# Patient Record
Sex: Male | Born: 1960
Health system: Southern US, Community
[De-identification: ages and names within clinical notes are randomized; demographics above are authoritative.]

## PROBLEM LIST (undated history)

## (undated) DIAGNOSIS — J45909 Unspecified asthma, uncomplicated: Secondary | ICD-10-CM

## (undated) DIAGNOSIS — R7303 Prediabetes: Secondary | ICD-10-CM

## (undated) DIAGNOSIS — T7840XA Allergy, unspecified, initial encounter: Secondary | ICD-10-CM

## (undated) DIAGNOSIS — J309 Allergic rhinitis, unspecified: Secondary | ICD-10-CM

## (undated) DIAGNOSIS — E785 Hyperlipidemia, unspecified: Secondary | ICD-10-CM

## (undated) DIAGNOSIS — M1990 Unspecified osteoarthritis, unspecified site: Secondary | ICD-10-CM

## (undated) DIAGNOSIS — I10 Essential (primary) hypertension: Secondary | ICD-10-CM

## (undated) DIAGNOSIS — H3320 Serous retinal detachment, unspecified eye: Secondary | ICD-10-CM

## (undated) HISTORY — DX: Allergy, unspecified, initial encounter: T78.40XA

## (undated) HISTORY — DX: Unspecified osteoarthritis, unspecified site: M19.90

## (undated) HISTORY — DX: Essential (primary) hypertension: I10

## (undated) HISTORY — PX: REFRACTIVE SURGERY: SHX103

## (undated) HISTORY — DX: Unspecified asthma, uncomplicated: J45.909

## (undated) HISTORY — PX: HERNIA REPAIR: SHX51

## (undated) HISTORY — DX: Allergic rhinitis, unspecified: J30.9

## (undated) HISTORY — DX: Serous retinal detachment, unspecified eye: H33.20

## (undated) HISTORY — DX: Prediabetes: R73.03

## (undated) HISTORY — DX: Hyperlipidemia, unspecified: E78.5

---

## 2000-10-03 HISTORY — PX: RETINAL LASER PROCEDURE: SHX2339

## 2012-09-03 ENCOUNTER — Ambulatory Visit (INDEPENDENT_AMBULATORY_CARE_PROVIDER_SITE_OTHER): Payer: BC Managed Care – PPO | Admitting: Internal Medicine

## 2012-09-03 ENCOUNTER — Encounter: Payer: Self-pay | Admitting: Internal Medicine

## 2012-09-03 VITALS — BP 138/86 | HR 88 | Temp 98.0°F | Ht 72.0 in | Wt 226.0 lb

## 2012-09-03 DIAGNOSIS — E785 Hyperlipidemia, unspecified: Secondary | ICD-10-CM

## 2012-09-03 DIAGNOSIS — M79609 Pain in unspecified limb: Secondary | ICD-10-CM

## 2012-09-03 DIAGNOSIS — F17200 Nicotine dependence, unspecified, uncomplicated: Secondary | ICD-10-CM

## 2012-09-03 DIAGNOSIS — Z720 Tobacco use: Secondary | ICD-10-CM | POA: Insufficient documentation

## 2012-09-03 DIAGNOSIS — M79641 Pain in right hand: Secondary | ICD-10-CM | POA: Insufficient documentation

## 2012-09-03 DIAGNOSIS — I10 Essential (primary) hypertension: Secondary | ICD-10-CM

## 2012-09-03 MED ORDER — AMLODIPINE BESYLATE 10 MG PO TABS: 10.0000 mg | ORAL_TABLET | Freq: Every day | ORAL | Status: DC

## 2012-09-03 NOTE — Assessment & Plan Note (Signed)
RF  meds , RTC 3-4 months See instructions

## 2012-09-03 NOTE — Patient Instructions (Addendum)
Check the  blood pressure 2 or 3 times a week, be sure it is between 110/60 and 140/85. If it is consistently higher or lower, let me know  

## 2012-09-03 NOTE — Progress Notes (Signed)
  Subjective:    Patient ID: Gary Zimmerman, male    DOB: 1961/09/04, 51 y.o.   MRN: [id number]  HPI New patient, to get established. Hypertension, good medication compliance, amb blood pressures usually okay but from time to time it goes up to 140/90. He moved to this area a few months ago, since then his job is more physical and  is having right hand and wrist pain; occasionally the whole hand gets numb..  Past Medical History  Diagnosis Date  . Asthma     no problems in years   . Allergic rhinitis   . Mild hyperlipidemia   . HTN (hypertension)    Past Surgical History  Procedure Date  . Hernia repair     multiple as a child  . Retinal laser procedure 2002     R , x 2   . Refractive surgery    Family History  Problem Relation Age of Onset  . Diabetes Sister   . CAD      ?F, uncle  . Stroke      cousin  . Colon cancer Neg Hx   . Prostate cancer Neg Hx   . Breast cancer      MGM   History   Social History  . Marital Status: Married    Spouse Name: N/A    Number of Children: 3  . Years of Education: N/A   Occupational History  . plant Production designer, theatre/television/film     Social History Main Topics  . Smoking status: Former Games developer  . Smokeless tobacco: Current User    Types: Chew     Comment: quit x years , restarted ~ 2010  . Alcohol Use: Yes     Comment: socially   . Drug Use: No  . Sexually Active: Not on file   Other Topics Concern  . Not on file   Social History Narrative   Some college--2 sons, 1 daughter --Moved from [place] to [place] 11-2011     Review of Systems Denies any left hand problems. No lower extremity edema Denies any neck pain. Occasionally depending on the position he sleeps, his whole right arm gets numb, this is going on for years.     Objective:   Physical Exam General -- alert, well-developed Lungs -- normal respiratory effort, no intercostal retractions, no accessory muscle use, and normal breath sounds.   Heart-- normal rate, regular rhythm, no murmur,  and no gallop.   Extremities-- no pretibial edema bilaterally ; inspection on palpation of the wrists and hands without evidence of synovitis, range of motion normal. Neurologic-- alert & oriented X3 , DTRs and strength normal in all extremities. Psych-- Cognition and judgment appear intact. Alert and cooperative with normal attention span and concentration.  not anxious appearing and not depressed appearing.       Assessment & Plan:

## 2012-09-03 NOTE — Assessment & Plan Note (Signed)
Quit tobacco for 17 years, unfortunately restarted around 2010. Risks discussed, information about quitting provided.

## 2012-09-03 NOTE — Assessment & Plan Note (Signed)
No evidence of synovitis, occasionally the whole right hand gets numb particularly with always use. Plan: Advil as needed, wrist splinter, if  Problems increase  will call for referral

## 2012-11-09 ENCOUNTER — Encounter: Payer: Self-pay | Admitting: Internal Medicine

## 2012-11-09 ENCOUNTER — Ambulatory Visit (INDEPENDENT_AMBULATORY_CARE_PROVIDER_SITE_OTHER): Payer: BC Managed Care – PPO | Admitting: Internal Medicine

## 2012-11-09 VITALS — BP 140/86 | HR 85 | Temp 97.9°F | Wt 226.0 lb

## 2012-11-09 DIAGNOSIS — J029 Acute pharyngitis, unspecified: Secondary | ICD-10-CM

## 2012-11-09 DIAGNOSIS — B9789 Other viral agents as the cause of diseases classified elsewhere: Secondary | ICD-10-CM

## 2012-11-09 DIAGNOSIS — B349 Viral infection, unspecified: Secondary | ICD-10-CM

## 2012-11-09 LAB — POCT INFLUENZA A/B: Influenza B, POC: NEGATIVE

## 2012-11-09 MED ORDER — HYDROCODONE-HOMATROPINE 5-1.5 MG/5ML PO SYRP: 5.0000 mL | ORAL_SOLUTION | Freq: Four times a day (QID) | ORAL | Status: DC | PRN

## 2012-11-09 MED ORDER — OSELTAMIVIR PHOSPHATE 75 MG PO CAPS: 75.0000 mg | ORAL_CAPSULE | Freq: Two times a day (BID) | ORAL | Status: DC

## 2012-11-09 NOTE — Progress Notes (Signed)
  Subjective:    Patient ID: Gary Zimmerman, male    DOB: June 07, 1961, 52 y.o.   MRN: [id number]  HPI Acute visit, here with his wife Got sick last night: Sore throat, glands in the neck swelling, severe chills but no fever, + sweats. Wife has been sick with respiratory symptoms, currently on her second round of antibiotics. Patient has been exposed also to  strep  Past Medical History  Diagnosis Date  . Asthma     no problems in years   . Allergic rhinitis   . Mild hyperlipidemia   . HTN (hypertension)   . Retinal detachment ~ 10-2012   Past Surgical History  Procedure Laterality Date  . Hernia repair      multiple as a child  . Retinal laser procedure  2002     R , x 2   . Refractive surgery      Review of Systems No much cough, some sinus congestion with a small amount of green discharge. No nausea, vomiting, diarrhea. Mild body aches. The patient had a retinal detachment a month ago, this morning he had a injection in the eye.    Objective:   Physical Exam General -- alert, well-developed, nontoxic-appearing.   Neck -- lymph nodes with minimal enlargement bilaterally, slightly tender.   HEENT -- TM R obscured by wax, left is normal ; throat w/o redness, face symmetric and not tender to palpation, nose congested. Left pupil moderately dilated and fixed (status post a local injection today)   EOMI Lungs -- normal respiratory effort, no intercostal retractions, no accessory muscle use, and normal breath sounds.   Heart-- normal rate, regular rhythm, no murmur, and no gallop.   Extremities-- no axillary lymphadenopathy  Psych-- Cognition and judgment appear intact. Alert and cooperative with normal attention span and concentration.  not anxious appearing and not depressed appearing.      Assessment & Plan:  History of respiratory symptoms, chills and aches. Strep a negative, flu test negative. The patient most likely has a viral syndrome, influenza?. Will prescribe Tamiflu,  if he  starts coughing, recommend Mucinex and hydrocodone, he recently had an eye  proceeded so will try to suppress the cough the best we can See instructions

## 2012-11-09 NOTE — Patient Instructions (Addendum)
Rest, fluids , tylenol If  cough, take Mucinex DM twice a day as needed  If the cough persist, use hydrocodone, will make you sleepy Take tamiflu x 5 days Call if no better in few days  Call anytime if the symptoms are severe

## 2012-11-10 ENCOUNTER — Encounter: Payer: Self-pay | Admitting: Internal Medicine

## 2013-01-02 ENCOUNTER — Ambulatory Visit (INDEPENDENT_AMBULATORY_CARE_PROVIDER_SITE_OTHER): Payer: BC Managed Care – PPO | Admitting: Internal Medicine

## 2013-01-02 ENCOUNTER — Encounter: Payer: Self-pay | Admitting: Internal Medicine

## 2013-01-02 VITALS — BP 136/84 | HR 66 | Temp 97.6°F | Ht 72.0 in | Wt 228.0 lb

## 2013-01-02 DIAGNOSIS — Z0000 Encounter for general adult medical examination without abnormal findings: Secondary | ICD-10-CM

## 2013-01-02 DIAGNOSIS — I10 Essential (primary) hypertension: Secondary | ICD-10-CM

## 2013-01-02 LAB — CBC WITH DIFFERENTIAL/PLATELET
Basophils Relative: 0.8 % (ref 0.0–3.0)
Eosinophils Relative: 7.1 % — ABNORMAL HIGH (ref 0.0–5.0)
HCT: 44.8 % (ref 39.0–52.0)
Hemoglobin: 15.4 g/dL (ref 13.0–17.0)
Lymphs Abs: 1.4 10*3/uL (ref 0.7–4.0)
Monocytes Relative: 6.9 % (ref 3.0–12.0)
Platelets: 155 10*3/uL (ref 150.0–400.0)
RBC: 5.03 Mil/uL (ref 4.22–5.81)
WBC: 5.4 10*3/uL (ref 4.5–10.5)

## 2013-01-02 LAB — LIPID PANEL
Cholesterol: 189 mg/dL (ref 0–200)
LDL Cholesterol: 123 mg/dL — ABNORMAL HIGH (ref 0–99)
Triglycerides: 143 mg/dL (ref 0.0–149.0)
VLDL: 28.6 mg/dL (ref 0.0–40.0)

## 2013-01-02 LAB — COMPREHENSIVE METABOLIC PANEL
BUN: 14 mg/dL (ref 6–23)
CO2: 25 mEq/L (ref 19–32)
Creatinine, Ser: 1 mg/dL (ref 0.4–1.5)
GFR: 86.83 mL/min (ref >60.00)
Glucose, Bld: 104 mg/dL — ABNORMAL HIGH (ref 70–99)
Total Bilirubin: 1.3 mg/dL — ABNORMAL HIGH (ref 0.3–1.2)
Total Protein: 6.8 g/dL (ref 6.0–8.3)

## 2013-01-02 LAB — PSA: PSA: 0.35 ng/mL (ref 0.10–4.00)

## 2013-01-02 MED ORDER — FLUTICASONE PROPIONATE 50 MCG/ACT NA SUSP: 2.0000 | Freq: Every day | NASAL | Status: DC

## 2013-01-02 NOTE — Assessment & Plan Note (Addendum)
Last Td? < 10 years Never had a cscope, colonoscopy versus iFOB discussed, prefers a colonoscopy, refer to GI Diet and exercise discussed Labs EKG sinus brady, asx, no old EKG Nasal congestio, allergies-- cont zyrtec, add flonase, call if no better

## 2013-01-02 NOTE — Patient Instructions (Addendum)
Check the  blood pressure 2 or 3 times a week,use a large (arm) cuff be sure it is between 110/60 and 140/85. If it is consistently higher or lower, let me know. Exercise 3 hours a week  If you feel well and the  blood pressure is normal, then come back in one year for another physical.

## 2013-01-02 NOTE — Assessment & Plan Note (Signed)
See review of systems, BP was elevated at some point, BP today normal. Plan: No change, motor his BP with a large cuff (no wrist cuff)

## 2013-01-02 NOTE — Progress Notes (Signed)
  Subjective:    Patient ID: Gary Zimmerman, male    DOB: May 20, 1961, 52 y.o.   MRN: [id number]  HPI CPX  Past Medical History  Diagnosis Date  . Asthma     no problems in years   . Allergic rhinitis   . Mild hyperlipidemia   . HTN (hypertension)   . Retinal detachment ~ 10-2012   Past Surgical History  Procedure Laterality Date  . Hernia repair      multiple as a child  . Retinal laser procedure  2002     R , x 2   . Refractive surgery     History   Social History  . Marital Status: Married    Spouse Name: N/A    Number of Children: 3  . Years of Education: N/A   Occupational History  . plant Production designer, theatre/television/film     Social History Main Topics  . Smoking status: Former Games developer  . Smokeless tobacco: Former Neurosurgeon    Types: Chew     Comment: quit chewing ~ 10-2012  . Alcohol Use: Yes     Comment: socially   . Drug Use: No  . Sexually Active: Not on file   Other Topics Concern  . Not on file   Social History Narrative   Some college    2 sons, 1 daughter     Linton Ham from [place] to [place] 11-2011    Diet: needs improvment   Exercise: active , some hicking   Family History  Problem Relation Age of Onset  . Diabetes Sister   . CAD      ?F, uncle  . Stroke      cousin  . Colon cancer Neg Hx   . Prostate cancer Neg Hx   . Breast cancer      MGM     Review of Systems Good compliance with medication. A few months ago he check his BP with wrist cuff, got high readings, 180/90, no recent BP checks. Quit tobacco!, Since then has gained some weight. Had a cold few weeks ago, since then he has a persisting nasal congestion with occasional bloody discharge. 2 days ago started with itchy eyes and nose, taking Zyrtec with some relief. Has a number of eye problems, fortunately doing well. Denies cough or wheezing No chest pain or shortness of breath No nausea, vomiting, blood in the stools. No dysuria or difficulty urinating.     Objective:   Physical Exam General -- alert,  well-developed,.   Neck --no thyromegaly  Lungs -- normal respiratory effort, no intercostal retractions, no accessory muscle use, and normal breath sounds.   Heart-- normal rate, regular rhythm, no murmur, and no gallop.   Abdomen--soft, non-tender, no distention, no masses, no HSM, no guarding, and no rigidity.   Extremities-- no pretibial edema bilaterally Rectal-- No external abnormalities noted. Normal sphincter tone. No rectal masses or tenderness. Brown stool, Hemoccult negative Prostate:  Prostate gland firm and smooth, no enlargement, nodularity, tenderness, mass, asymmetry or induration. Neurologic-- alert & oriented X3 and strength normal in all extremities. Psych-- Cognition and judgment appear intact. Alert and cooperative with normal attention span and concentration.  not anxious appearing and not depressed appearing.      Assessment & Plan:

## 2013-01-03 ENCOUNTER — Encounter: Payer: Self-pay | Admitting: *Deleted

## 2013-01-11 ENCOUNTER — Encounter: Payer: Self-pay | Admitting: Internal Medicine

## 2013-01-23 ENCOUNTER — Telehealth: Payer: Self-pay | Admitting: Internal Medicine

## 2013-01-23 NOTE — Telephone Encounter (Signed)
In reference to Gastroenterology referral entered on 01/02/13, patient has never scheduled.  [facility] attempted to reach patient and left voice mail for a return call.  I have also left messages and mailed a letter for patient to contact me.  As of today, 01/23/13, patient not responding.

## 2013-01-23 NOTE — Telephone Encounter (Signed)
Noted, Will discuss further on return to the office

## 2013-04-09 ENCOUNTER — Other Ambulatory Visit: Payer: Self-pay | Admitting: Internal Medicine

## 2013-04-10 NOTE — Telephone Encounter (Signed)
Refill done.  

## 2013-07-01 ENCOUNTER — Other Ambulatory Visit: Payer: Self-pay | Admitting: Internal Medicine

## 2013-07-01 DIAGNOSIS — E785 Hyperlipidemia, unspecified: Secondary | ICD-10-CM

## 2013-07-01 NOTE — Telephone Encounter (Signed)
Amlodipine refill sent to [facility] on [address]

## 2013-12-30 ENCOUNTER — Other Ambulatory Visit: Payer: Self-pay | Admitting: Internal Medicine

## 2014-01-27 ENCOUNTER — Other Ambulatory Visit: Payer: Self-pay | Admitting: Internal Medicine

## 2014-01-28 ENCOUNTER — Other Ambulatory Visit: Payer: Self-pay | Admitting: Internal Medicine

## 2014-02-28 ENCOUNTER — Other Ambulatory Visit: Payer: Self-pay | Admitting: Internal Medicine

## 2014-04-30 ENCOUNTER — Other Ambulatory Visit: Payer: Self-pay | Admitting: Internal Medicine

## 2014-05-14 ENCOUNTER — Telehealth: Payer: Self-pay | Admitting: Internal Medicine

## 2014-05-14 MED ORDER — AMLODIPINE BESYLATE 10 MG PO TABS: ORAL_TABLET | ORAL | Status: DC

## 2014-05-14 NOTE — Telephone Encounter (Signed)
Caller name: Marvene StaffJohn Ekstrand Relation to pt: self  Call back number: [phone] Pharmacy: [facility] [phone]   Reason for call: pt had to cancel med follow up due to death in family. Pt is requesting a 2 month refill amLODipine (NORVASC) 10 MG  to hold him over until he's annual appointment 05/16/14. Please advise

## 2014-05-14 NOTE — Telephone Encounter (Signed)
Refill sent.

## 2014-05-16 ENCOUNTER — Ambulatory Visit: Payer: BC Managed Care – PPO | Admitting: Internal Medicine

## 2014-06-24 ENCOUNTER — Ambulatory Visit (INDEPENDENT_AMBULATORY_CARE_PROVIDER_SITE_OTHER): Payer: BC Managed Care – PPO | Admitting: Internal Medicine

## 2014-06-24 ENCOUNTER — Telehealth: Payer: Self-pay | Admitting: Internal Medicine

## 2014-06-24 ENCOUNTER — Encounter: Payer: Self-pay | Admitting: Internal Medicine

## 2014-06-24 VITALS — BP 132/84 | HR 78 | Temp 98.0°F | Ht 72.0 in | Wt 231.1 lb

## 2014-06-24 DIAGNOSIS — Z0000 Encounter for general adult medical examination without abnormal findings: Secondary | ICD-10-CM

## 2014-06-24 DIAGNOSIS — Z720 Tobacco use: Secondary | ICD-10-CM

## 2014-06-24 DIAGNOSIS — I10 Essential (primary) hypertension: Secondary | ICD-10-CM

## 2014-06-24 DIAGNOSIS — Z23 Encounter for immunization: Secondary | ICD-10-CM

## 2014-06-24 DIAGNOSIS — R351 Nocturia: Secondary | ICD-10-CM

## 2014-06-24 MED ORDER — AMLODIPINE BESYLATE 10 MG PO TABS: ORAL_TABLET | ORAL | Status: DC

## 2014-06-24 MED ORDER — FLUTICASONE PROPIONATE 50 MCG/ACT NA SUSP: NASAL | Status: DC

## 2014-06-24 NOTE — Patient Instructions (Signed)
Please go to the lab for a urine sample today.  Stop by the front desk and schedule labs to be done within few days (fasting) CMP, CBC, FLP, TSH, PSA, A1c ----dx V70  Please come back to the office in 6 months  for a routine check up   Check the  blood pressure 2 or 3 times a month  be sure it is between 110/60 and 140/85. Ideal blood pressure is 120/80. If it is consistently higher or lower, let me know

## 2014-06-24 NOTE — Progress Notes (Signed)
Pre visit review using our clinic review tool, if applicable. No additional management support is needed unless otherwise documented below in the visit note. 

## 2014-06-24 NOTE — Assessment & Plan Note (Signed)
Discuss risks, needs to see the dentist regularly, Wellbutrin? Chantix?

## 2014-06-24 NOTE — Telephone Encounter (Signed)
Caller name: Xhaiden  Relation to pt: self  Call back number: [phone] Pharmacy: [facility]   [phone]  Reason for call: any future RX pt would like sent to [facility]

## 2014-06-24 NOTE — Telephone Encounter (Signed)
Pharmacy changed in Pt chart.

## 2014-06-24 NOTE — Progress Notes (Signed)
Subjective:    Patient ID: Gary Zimmerman, male    DOB: 04-03-1961, 53 y.o.   MRN: [id number]  DOS:  06/24/2014 Type of visit - description : CPX Interval history: Hypertension, good medication compliance, ambulatory BPs usually in the 130s, occasionally 140s if he is anxious or stressed out. Also complained of nocturia sometimes one or 2 times. Also complained of the whole arm "falling asleep" sometimes the left, sometimes the R depended on how he sleeps. No neck or back pain. No lower extremity paresthesias.    ROS  Denies chest pain, difficulty breathing or lower extremity edema No nausea, vomiting, diarrhea blood in the stools. No dysuria, gross hematuria or difficulty urinating  Past Medical History  Diagnosis Date  . Asthma     no problems in years   . Allergic rhinitis   . Mild hyperlipidemia   . HTN (hypertension)   . Retinal detachment ~ 10-2012    sees eye MD    Past Surgical History  Procedure Laterality Date  . Hernia repair      multiple as a child  . Retinal laser procedure  2002     R , x 2   . Refractive surgery      History   Social History  . Marital Status: Married    Spouse Name: N/A    Number of Children: 3  . Years of Education: N/A   Occupational History  . plant Production designer, theatre/television/film     Social History Main Topics  . Smoking status: Former Games developer  . Smokeless tobacco: Current User    Types: Chew     Comment:    . Alcohol Use: Yes     Comment: socially   . Drug Use: No  . Sexual Activity: Not on file   Other Topics Concern  . Not on file   Social History Narrative   Some college    2 sons, 1 daughter     Linton Ham from [place] to [place] 11-2011          Family History  Problem Relation Age of Onset  . Diabetes Sister   . CAD Other     ?F, uncle  . Stroke Other     cousin  . Colon cancer Neg Hx   . Prostate cancer Neg Hx   . Breast cancer Other     MGM       Medication List       This list is accurate as of: 06/24/14 11:59 PM.  Always  use your most recent med list.               amLODipine 10 MG tablet  Commonly known as:  NORVASC  TAKE 1 TABLET BY MOUTH EVERY DAY     cetirizine 10 MG tablet  Commonly known as:  ZYRTEC  Take 10 mg by mouth daily.     fish oil-omega-3 fatty acids 1000 MG capsule  Take 1 g by mouth daily.     fluticasone 50 MCG/ACT nasal spray  Commonly known as:  FLONASE  USE 2 SPRAYS NASALLY DAILY.     multivitamin tablet  Take 1 tablet by mouth daily.           Objective:   Physical Exam BP 132/84  Pulse 78  Temp(Src) 98 F (36.7 C) (Oral)  Ht 6' (1.829 m)  Wt 231 lb 2 oz (104.838 kg)  BMI 31.34 kg/m2  SpO2 98% General -- alert, well-developed, NAD.  Neck --no thyromegaly ,  normal carotid pulse  HEENT-- Not pale.   Lungs -- normal respiratory effort, no intercostal retractions, no accessory muscle use, and normal breath sounds.  Heart-- normal rate, regular rhythm, no murmur.  Abdomen-- Not distended, good bowel sounds,soft, non-tender. Rectal-- No external abnormalities noted. Normal sphincter tone. No rectal masses or tenderness. Stool brown, Hemoccult negative  Prostate--Prostate gland firm and smooth, no enlargement, nodularity, tenderness, mass, asymmetry or induration. Extremities-- no pretibial edema bilaterally  Neurologic--  alert & oriented X3. Speech normal, gait appropriate for age, strength symmetric and appropriate for age.  Psych-- Cognition and judgment appear intact. Cooperative with normal attention span and concentration. No anxious or depressed appearing.       Assessment & Plan:

## 2014-06-24 NOTE — Assessment & Plan Note (Signed)
Ambulatory BPs well-controlled most of the time, occasionally in the 140s. Plan: Continue with present care, continue taking amb BPs, see instructions

## 2014-06-24 NOTE — Assessment & Plan Note (Signed)
Last Td? Today Flu shot today Never had a cscope,   refer to GI again Diet and exercise discussed Labs Include the A1c Other issues: Nocturia, DRE normal with taking a PSA and a urinalysis Upper extremity paresthesias, likely positional, recommend observation

## 2014-06-25 LAB — URINALYSIS, ROUTINE W REFLEX MICROSCOPIC
Bilirubin Urine: NEGATIVE
Hgb urine dipstick: NEGATIVE
KETONES UR: NEGATIVE
Leukocytes, UA: NEGATIVE
Nitrite: NEGATIVE
PH: 6 (ref 5.0–8.0)
RBC / HPF: NONE SEEN (ref 0–?)
SPECIFIC GRAVITY, URINE: 1.01 (ref 1.000–1.030)
Total Protein, Urine: NEGATIVE
Urine Glucose: NEGATIVE
Urobilinogen, UA: 0.2 (ref 0.0–1.0)

## 2014-06-25 LAB — URINE CULTURE
Colony Count: NO GROWTH
Organism ID, Bacteria: NO GROWTH

## 2014-06-26 ENCOUNTER — Other Ambulatory Visit (INDEPENDENT_AMBULATORY_CARE_PROVIDER_SITE_OTHER): Payer: BC Managed Care – PPO

## 2014-06-26 DIAGNOSIS — Z0000 Encounter for general adult medical examination without abnormal findings: Secondary | ICD-10-CM

## 2014-06-26 LAB — PSA: PSA: 0.3 ng/mL (ref 0.10–4.00)

## 2014-06-26 LAB — CBC WITH DIFFERENTIAL/PLATELET
BASOS ABS: 0 10*3/uL (ref 0.0–0.1)
Basophils Relative: 0.5 % (ref 0.0–3.0)
Eosinophils Absolute: 0.3 10*3/uL (ref 0.0–0.7)
Eosinophils Relative: 4.3 % (ref 0.0–5.0)
HEMATOCRIT: 43.7 % (ref 39.0–52.0)
Hemoglobin: 14.9 g/dL (ref 13.0–17.0)
LYMPHS ABS: 1.3 10*3/uL (ref 0.7–4.0)
Lymphocytes Relative: 21.7 % (ref 12.0–46.0)
MCHC: 34.1 g/dL (ref 30.0–36.0)
MCV: 89.4 fl (ref 78.0–100.0)
MONOS PCT: 8 % (ref 3.0–12.0)
Monocytes Absolute: 0.5 10*3/uL (ref 0.1–1.0)
NEUTROS ABS: 3.8 10*3/uL (ref 1.4–7.7)
Neutrophils Relative %: 65.5 % (ref 43.0–77.0)
Platelets: 168 10*3/uL (ref 150.0–400.0)
RBC: 4.89 Mil/uL (ref 4.22–5.81)
RDW: 12.3 % (ref 11.5–15.5)
WBC: 5.8 10*3/uL (ref 4.0–10.5)

## 2014-06-26 LAB — TSH: TSH: 2.78 u[IU]/mL (ref 0.35–4.50)

## 2014-06-26 LAB — LIPID PANEL
CHOLESTEROL: 200 mg/dL (ref 0–200)
HDL: 37.2 mg/dL — ABNORMAL LOW (ref >39.00)
LDL Cholesterol: 123 mg/dL — ABNORMAL HIGH (ref 0–99)
NonHDL: 162.8
Total CHOL/HDL Ratio: 5
Triglycerides: 197 mg/dL — ABNORMAL HIGH (ref 0.0–149.0)
VLDL: 39.4 mg/dL (ref 0.0–40.0)

## 2014-06-26 LAB — COMPREHENSIVE METABOLIC PANEL
ALT: 40 U/L (ref 0–53)
AST: 29 U/L (ref 0–37)
Albumin: 3.9 g/dL (ref 3.5–5.2)
Alkaline Phosphatase: 65 U/L (ref 39–117)
BILIRUBIN TOTAL: 1.2 mg/dL (ref 0.2–1.2)
BUN: 12 mg/dL (ref 6–23)
CALCIUM: 8.9 mg/dL (ref 8.4–10.5)
CO2: 29 mEq/L (ref 19–32)
CREATININE: 1.1 mg/dL (ref 0.4–1.5)
Chloride: 102 mEq/L (ref 96–112)
GFR: 74.66 mL/min (ref >60.00)
Glucose, Bld: 124 mg/dL — ABNORMAL HIGH (ref 70–99)
Potassium: 4.1 mEq/L (ref 3.5–5.1)
Sodium: 137 mEq/L (ref 135–145)
Total Protein: 6.8 g/dL (ref 6.0–8.3)

## 2014-06-26 LAB — HEMOGLOBIN A1C: Hgb A1c MFr Bld: 6.3 % (ref 4.6–6.5)

## 2014-06-27 ENCOUNTER — Encounter: Payer: Self-pay | Admitting: Internal Medicine

## 2014-10-31 ENCOUNTER — Ambulatory Visit (INDEPENDENT_AMBULATORY_CARE_PROVIDER_SITE_OTHER): Payer: BLUE CROSS/BLUE SHIELD | Admitting: Internal Medicine

## 2014-10-31 ENCOUNTER — Encounter: Payer: Self-pay | Admitting: Internal Medicine

## 2014-10-31 VITALS — BP 168/81 | HR 56 | Temp 98.2°F | Ht 72.0 in | Wt 233.2 lb

## 2014-10-31 DIAGNOSIS — M159 Polyosteoarthritis, unspecified: Secondary | ICD-10-CM

## 2014-10-31 DIAGNOSIS — Z720 Tobacco use: Secondary | ICD-10-CM

## 2014-10-31 DIAGNOSIS — I10 Essential (primary) hypertension: Secondary | ICD-10-CM

## 2014-10-31 DIAGNOSIS — M1990 Unspecified osteoarthritis, unspecified site: Secondary | ICD-10-CM

## 2014-10-31 DIAGNOSIS — M150 Primary generalized (osteo)arthritis: Secondary | ICD-10-CM

## 2014-10-31 HISTORY — DX: Unspecified osteoarthritis, unspecified site: M19.90

## 2014-10-31 MED ORDER — VARENICLINE TARTRATE 0.5 MG X 11 & 1 MG X 42 PO MISC: ORAL | Status: DC

## 2014-10-31 MED ORDER — VARENICLINE TARTRATE 1 MG PO TABS: 1.0000 mg | ORAL_TABLET | Freq: Two times a day (BID) | ORAL | Status: DC

## 2014-10-31 NOTE — Assessment & Plan Note (Signed)
Extensive discussion about tobacco cessation including nicotine supplementation, counseling, Wellbutrin, Chantix. He elected Chantix, how to use this medication and when to quit discuss with the patient. Prescription provided.

## 2014-10-31 NOTE — Assessment & Plan Note (Signed)
BP slightly elevated today, normal ambulatory BPs, no change

## 2014-10-31 NOTE — Progress Notes (Signed)
Subjective:    Patient ID: Gary Zimmerman, male    DOB: [birth year], 54 y.o.   MRN: [id number]  DOS:  10/31/2014 Type of visit - description : Here to discuss several concerns Interval history: Needs a referral for a colonoscopy Likes to quit tobacco, in the past has not tried any medications. Hypertension, BP noted to be slightly elevated, good compliance of medication, ambulatory BP usually normal Also DJD? Continue with shoulder pain, occasionally left hip pain after a heavy day of work.   ROS Denies fever, chills, weight loss or headaches Very seldom his arm goes to sleep after he sleeps, sx resolve quickly, no actual neck pain w/ radiation    Past Medical History  Diagnosis Date  . Asthma     no problems in years   . Allergic rhinitis   . Mild hyperlipidemia   . HTN (hypertension)   . Retinal detachment ~ 10-2012    sees eye MD  . DJD (degenerative joint disease) 10/31/2014    Past Surgical History  Procedure Laterality Date  . Hernia repair      multiple as a child  . Retinal laser procedure  2002     R , x 2   . Refractive surgery      History   Social History  . Marital Status: Married    Spouse Name: N/A    Number of Children: 3  . Years of Education: N/A   Occupational History  . plant Production designer, theatre/television/filmmanager     Social History Main Topics  . Smoking status: Former Games developermoker  . Smokeless tobacco: Current User    Types: Chew     Comment:    . Alcohol Use: Yes     Comment: socially   . Drug Use: No  . Sexual Activity: Not on file   Other Topics Concern  . Not on file   Social History Narrative   Some college    2 sons, 1 daughter     [place]             Medication List       This list is accurate as of: 10/31/14 11:59 PM.  Always use your most recent med list.               amLODipine 10 MG tablet  Commonly known as:  NORVASC  TAKE 1 TABLET BY MOUTH EVERY DAY     cetirizine 10 MG tablet  Commonly known as:  ZYRTEC  Take 10 mg  by mouth daily as needed.     fish oil-omega-3 fatty acids 1000 MG capsule  Take 1 g by mouth daily.     fluticasone 50 MCG/ACT nasal spray  Commonly known as:  FLONASE  USE 2 SPRAYS NASALLY DAILY.     multivitamin tablet  Take 1 tablet by mouth daily.     varenicline 1 MG tablet  Commonly known as:  CHANTIX  Take 1 tablet (1 mg total) by mouth 2 (two) times daily.     varenicline 0.5 MG X 11 & 1 MG X 42 tablet  Commonly known as:  CHANTIX STARTING MONTH PAK  Take one 0.5 mg tablet by mouth once daily for 3 days, then increase to one 0.5 mg tablet twice daily for 4 days, then increase to one 1 mg tablet twice daily.           Objective:   Physical Exam BP 168/81 mmHg  Pulse 56  Temp(Src) 98.2 F (36.8 C) (Oral)  Ht 6' (1.829 m)  Wt 233 lb 4 oz (105.802 kg)  BMI 31.63 kg/m2  SpO2 100%  General -- alert, well-developed, NAD.  Neck --no TTP, FROM HEENT-- Not pale.   Extremities-- no pretibial edema bilaterally ; hands and wrists without synovitis Neurologic--  alert & oriented X3. Speech normal, gait appropriate for age, strength symmetric and appropriate for age.  DTRs symmetric. Psych-- Cognition and judgment appear intact. Cooperative with normal attention span and concentration. No anxious or depressed appearing.         Assessment & Plan:    Today , I spent more than 25   min with the patient: >50% of the time counseling regards tobacco cessation, possible referral to ortho

## 2014-10-31 NOTE — Assessment & Plan Note (Addendum)
Long history of shoulder pain and occasional left hip pain, no hand-wrist synovitis on exam, no headaches or weight loss. Suspect DJD, discussed the use of Tylenol as needed, also discussed possibly a referral to ortho or sports medicine if  one specific joint is hurting more than others.

## 2014-10-31 NOTE — Patient Instructions (Signed)
Take Chantix for 3 months The first  month use  the starting package Quit tobacco 2 weeks after you start Chantix Please read  the side effects carefully    If you need more information about quitting tobacco  visit  the American Heart Association, it  is a great resource online at:  Mormon101.plHttp://www.heart.org/HEARTORG/   Tylenol  500 mg OTC 2 tabs a day every 8 hours as needed for pain  If the pain is  gradually increasing, please call for a referral

## 2014-10-31 NOTE — Progress Notes (Signed)
Pre visit review using our clinic review tool, if applicable. No additional management support is needed unless otherwise documented below in the visit note. 

## 2014-11-13 ENCOUNTER — Encounter: Payer: Self-pay | Admitting: Gastroenterology

## 2014-12-12 ENCOUNTER — Ambulatory Visit (AMBULATORY_SURGERY_CENTER): Payer: Self-pay | Admitting: *Deleted

## 2014-12-12 VITALS — Ht 71.0 in | Wt 236.8 lb

## 2014-12-12 DIAGNOSIS — Z1211 Encounter for screening for malignant neoplasm of colon: Secondary | ICD-10-CM

## 2014-12-12 MED ORDER — NA SULFATE-K SULFATE-MG SULF 17.5-3.13-1.6 GM/177ML PO SOLN: 1.0000 | Freq: Once | ORAL | Status: DC

## 2014-12-12 NOTE — Progress Notes (Signed)
No egg or soy allergy No issues with pasts sedation No diet pills No home 02 use, no c pap emmi video to email

## 2014-12-25 ENCOUNTER — Ambulatory Visit (INDEPENDENT_AMBULATORY_CARE_PROVIDER_SITE_OTHER): Payer: BLUE CROSS/BLUE SHIELD | Admitting: Internal Medicine

## 2014-12-25 ENCOUNTER — Encounter: Payer: Self-pay | Admitting: Internal Medicine

## 2014-12-25 VITALS — BP 128/82 | HR 77 | Temp 98.0°F | Ht 71.0 in | Wt 237.5 lb

## 2014-12-25 DIAGNOSIS — R7309 Other abnormal glucose: Secondary | ICD-10-CM | POA: Diagnosis not present

## 2014-12-25 DIAGNOSIS — R7303 Prediabetes: Secondary | ICD-10-CM

## 2014-12-25 DIAGNOSIS — Z720 Tobacco use: Secondary | ICD-10-CM

## 2014-12-25 DIAGNOSIS — E119 Type 2 diabetes mellitus without complications: Secondary | ICD-10-CM | POA: Insufficient documentation

## 2014-12-25 HISTORY — DX: Prediabetes: R73.03

## 2014-12-25 LAB — HEMOGLOBIN A1C: Hgb A1c MFr Bld: 6.4 % (ref 4.6–6.5)

## 2014-12-25 NOTE — Progress Notes (Signed)
Pre visit review using our clinic review tool, if applicable. No additional management support is needed unless otherwise documented below in the visit note. 

## 2014-12-25 NOTE — Assessment & Plan Note (Signed)
Doing great with Chantix, wonders if he has to "wean off" of that medication, recommend to follow a standard protocol and use it for 3 months

## 2014-12-25 NOTE — Progress Notes (Signed)
Subjective:    Patient ID: Gary Zimmerman, male    DOB: 07/01/1961, 54 y.o.   MRN: [id number]  DOS:  12/25/2014 Type of visit - description : rov Interval history: Follow-up from previous visit. Diagnosed with prediabetes, doing better with diet and exercise Quitting tobacco, on his first month of Chantix, doing great, abstinent for 2 weeks, very little cravings   Review of Systems Had nausea initially after he started Chantix but that is gone. Has noted increased thirst lately  Past Medical History  Diagnosis Date  . Asthma     no problems in years   . Allergic rhinitis   . Mild hyperlipidemia   . HTN (hypertension)   . Retinal detachment ~ 10-2012    sees eye MD  . DJD (degenerative joint disease) 10/31/2014  . Allergy     seasonal  . Prediabetes 12/25/2014    Past Surgical History  Procedure Laterality Date  . Hernia repair      multiple as a child  . Retinal laser procedure  2002     R , x 2   . Refractive surgery      lasik    History   Social History  . Marital Status: Married    Spouse Name: N/A  . Number of Children: 3  . Years of Education: N/A   Occupational History  . plant Freight forwarder     Social History Main Topics  . Smoking status: Former Research scientist (life sciences)  . Smokeless tobacco: Former Systems developer    Types: Chew     Comment:  on chantix  . Alcohol Use: 0.0 oz/week    0 Standard drinks or equivalent per week     Comment: socially   . Drug Use: No  . Sexual Activity: Not on file   Other Topics Concern  . Not on file   Social History Narrative   Some college    2 sons, 1 daughter     Dorie Rank from [place] to [place] 11-2011             Medication List       This list is accurate as of: 12/25/14 11:59 PM.  Always use your most recent med list.               amLODipine 10 MG tablet  Commonly known as:  NORVASC  TAKE 1 TABLET BY MOUTH EVERY DAY     cetirizine 10 MG tablet  Commonly known as:  ZYRTEC  Take 10 mg by mouth daily as needed.     fish  oil-omega-3 fatty acids 1000 MG capsule  Take 1 g by mouth daily.     fluticasone 50 MCG/ACT nasal spray  Commonly known as:  FLONASE  USE 2 SPRAYS NASALLY DAILY.     multivitamin tablet  Take 1 tablet by mouth daily.     Na Sulfate-K Sulfate-Mg Sulf Soln  Commonly known as:  SUPREP BOWEL PREP  Take 1 kit by mouth once. suprep as directed. No substitutions     varenicline 1 MG tablet  Commonly known as:  CHANTIX  Take 1 tablet (1 mg total) by mouth 2 (two) times daily.     varenicline 0.5 MG X 11 & 1 MG X 42 tablet  Commonly known as:  CHANTIX STARTING MONTH PAK  Take one 0.5 mg tablet by mouth once daily for 3 days, then increase to one 0.5 mg tablet twice daily for 4 days, then increase to one 1 mg tablet twice  daily.           Objective:   Physical Exam BP 128/82 mmHg  Pulse 77  Temp(Src) 98 F (36.7 C) (Oral)  Ht 5' 11"  (1.803 m)  Wt 237 lb 8 oz (107.729 kg)  BMI 33.14 kg/m2  SpO2 98% General:   Well developed, well nourished . NAD.   Neurologic:  alert & oriented X3.  Speech normal, gait appropriate for age and unassisted Psych--  Cognition and judgment appear intact.  Cooperative with normal attention span and concentration.  Behavior appropriate. No anxious or depressed appearing.       Assessment & Plan:

## 2014-12-25 NOTE — Assessment & Plan Note (Signed)
Last A1c discussed, Concept of A1c explained. We talk about diet, exercise and the importance of self learning, see instructions.  Today , I spent more than 15   min with the patient: >50% of the time counseling regards  diet, exercise, A1c meaning, self learning

## 2014-12-25 NOTE — Patient Instructions (Signed)
Get your blood work before you leave    Come back to the office in 4 months   for a routine check up    Two great books to learn about diabetes Diabetes for Dummies "The [facility] Diabetes diet" book   All about diabetes, great resource!  http://www.molina.com/http://www.[email or web address]/diabetes-information.html

## 2015-01-01 ENCOUNTER — Ambulatory Visit (AMBULATORY_SURGERY_CENTER): Payer: BLUE CROSS/BLUE SHIELD | Admitting: Gastroenterology

## 2015-01-01 ENCOUNTER — Encounter: Payer: Self-pay | Admitting: Gastroenterology

## 2015-01-01 VITALS — BP 125/86 | HR 70 | Temp 98.5°F | Resp 21 | Ht 71.0 in | Wt 236.0 lb

## 2015-01-01 DIAGNOSIS — Z1211 Encounter for screening for malignant neoplasm of colon: Secondary | ICD-10-CM | POA: Diagnosis not present

## 2015-01-01 DIAGNOSIS — K5730 Diverticulosis of large intestine without perforation or abscess without bleeding: Secondary | ICD-10-CM

## 2015-01-01 MED ORDER — SODIUM CHLORIDE 0.9 % IV SOLN: 500.0000 mL | INTRAVENOUS | Status: DC

## 2015-01-01 NOTE — Progress Notes (Signed)
Report to PACU, RN, vss, BBS= Clear.  

## 2015-01-01 NOTE — Op Note (Signed)
[facility] [address] [place], [id number]   COLONOSCOPY PROCEDURE REPORT  PATIENT: Gary Zimmerman, Gary Zimmerman  MR#: [id number] BIRTHDATE: 08/12/1961 , 53  yrs. old GENDER: male ENDOSCOPIST: Louis Meckelobert D Finbar Nippert, MD REFERRED XB:JYNWBY:Jose Drue NovelPaz, M.D. PROCEDURE DATE:  01/01/2015 PROCEDURE:   Colonoscopy, screening First Screening Colonoscopy - Avg.  risk and is 50 yrs.  old or older Yes.  Prior Negative Screening - Now for repeat screening. N/A  History of Adenoma - Now for follow-up colonoscopy & has been > or = to 3 yrs.  N/A ASA CLASS:   Class II INDICATIONS:Colorectal Neoplasm Risk Assessment for this procedure is average risk. MEDICATIONS: Monitored anesthesia care and Propofol 240 mg IV  DESCRIPTION OF PROCEDURE:   After the risks benefits and alternatives of the procedure were thoroughly explained, informed consent was obtained.  The digital rectal exam revealed no abnormalities of the rectum.   The LB [id number]  endoscope was introduced through the anus and advanced to the cecum, which was identified by both the appendix and ileocecal valve. No adverse events experienced.   The quality of the prep was (Suprep was used) excellent.  The instrument was then slowly withdrawn as the colon was fully examined.      COLON FINDINGS: There was mild diverticulosis noted in the sigmoid colon.   The examination was otherwise normal.  Retroflexed views revealed no abnormalities. The time to cecum = 3.3 Withdrawal time = 7.9   The scope was withdrawn and the procedure completed. COMPLICATIONS: There were no immediate complications.  ENDOSCOPIC IMPRESSION: 1.   Mild diverticulosis was noted in the sigmoid colon 2.   The examination was otherwise normal  RECOMMENDATIONS: Continue current colorectal screening recommendations for "routine risk" patients with a repeat colonoscopy in 10 years.  eSigned:  Louis Meckelobert D Perri Lamagna, MD 01/01/2015 2:03 PM   cc:

## 2015-01-01 NOTE — Patient Instructions (Signed)
YOU HAD AN ENDOSCOPIC PROCEDURE TODAY AT THE [facility]:   Refer to the procedure report that was given to you for any specific questions about what was found during the examination.  If the procedure report does not answer your questions, please call your gastroenterologist to clarify.  If you requested that your care partner not be given the details of your procedure findings, then the procedure report has been included in a sealed envelope for you to review at your convenience later.  YOU SHOULD EXPECT: Some feelings of bloating in the abdomen. Passage of more gas than usual.  Walking can help get rid of the air that was put into your GI tract during the procedure and reduce the bloating. If you had a lower endoscopy (such as a colonoscopy or flexible sigmoidoscopy) you may notice spotting of blood in your stool or on the toilet paper. If you underwent a bowel prep for your procedure, you may not have a normal bowel movement for a few days.  Please Note:  You might notice some irritation and congestion in your nose or some drainage.  This is from the oxygen used during your procedure.  There is no need for concern and it should clear up in a day or so.  SYMPTOMS TO REPORT IMMEDIATELY:   Following lower endoscopy (colonoscopy or flexible sigmoidoscopy):  Excessive amounts of blood in the stool  Significant tenderness or worsening of abdominal pains  Swelling of the abdomen that is new, acute  Fever of 100F or higher    For urgent or emergent issues, a gastroenterologist can be reached at any hour by calling [phone].   DIET: Your first meal following the procedure should be a small meal and then it is ok to progress to your normal diet. Heavy or fried foods are harder to digest and may make you feel nauseous or bloated.  Likewise, meals heavy in dairy and vegetables can increase bloating.  Drink plenty of fluids but you should avoid alcoholic beverages for 24  hours.  ACTIVITY:  You should plan to take it easy for the rest of today and you should NOT DRIVE or use heavy machinery until tomorrow (because of the sedation medicines used during the test).    FOLLOW UP: Our staff will call the number listed on your records the next business day following your procedure to check on you and address any questions or concerns that you may have regarding the information given to you following your procedure. If we do not reach you, we will leave a message.  However, if you are feeling well and you are not experiencing any problems, there is no need to return our call.  We will assume that you have returned to your regular daily activities without incident.  If any biopsies were taken you will be contacted by phone or by letter within the next 1-3 weeks.  Please call us at [phone] if you have not heard about the biopsies in 3 weeks.    SIGNATURES/CONFIDENTIALITY: You and/or your care partner have signed paperwork which will be entered into your electronic medical record.  These signatures attest to the fact that that the information above on your After Visit Summary has been reviewed and is understood.  Full responsibility of the confidentiality of this discharge information lies with you and/or your care-partner.   Information on diverticulosis and high fiber diet given to you today 

## 2015-01-02 ENCOUNTER — Telehealth: Payer: Self-pay | Admitting: *Deleted

## 2015-01-02 NOTE — Telephone Encounter (Signed)
  Follow up Call-  Call back number 01/01/2015  Post procedure Call Back phone  # [phone]  Permission to leave phone message Yes     Patient questions:  Do you have a fever, pain , or abdominal swelling? No. Pain Score  0 *  Have you tolerated food without any problems? Yes.    Have you been able to return to your normal activities? Yes.    Do you have any questions about your discharge instructions: Diet   No. Medications  No. Follow up visit  No.  Do you have questions or concerns about your Care? No.  Actions: * If pain score is 4 or above: No action needed, pain <4.

## 2015-01-30 ENCOUNTER — Encounter (HOSPITAL_BASED_OUTPATIENT_CLINIC_OR_DEPARTMENT_OTHER): Payer: Self-pay

## 2015-01-30 ENCOUNTER — Emergency Department (HOSPITAL_BASED_OUTPATIENT_CLINIC_OR_DEPARTMENT_OTHER): Payer: BLUE CROSS/BLUE SHIELD

## 2015-01-30 ENCOUNTER — Emergency Department (HOSPITAL_BASED_OUTPATIENT_CLINIC_OR_DEPARTMENT_OTHER)
Admission: EM | Admit: 2015-01-30 | Discharge: 2015-01-30 | Disposition: A | Discharge status: Home / Self Care | Payer: BLUE CROSS/BLUE SHIELD | Attending: Emergency Medicine | Admitting: Emergency Medicine

## 2015-01-30 DIAGNOSIS — Y929 Unspecified place or not applicable: Secondary | ICD-10-CM | POA: Insufficient documentation

## 2015-01-30 DIAGNOSIS — Z8739 Personal history of other diseases of the musculoskeletal system and connective tissue: Secondary | ICD-10-CM | POA: Diagnosis not present

## 2015-01-30 DIAGNOSIS — Z8669 Personal history of other diseases of the nervous system and sense organs: Secondary | ICD-10-CM | POA: Insufficient documentation

## 2015-01-30 DIAGNOSIS — J45909 Unspecified asthma, uncomplicated: Secondary | ICD-10-CM | POA: Insufficient documentation

## 2015-01-30 DIAGNOSIS — Z79899 Other long term (current) drug therapy: Secondary | ICD-10-CM | POA: Insufficient documentation

## 2015-01-30 DIAGNOSIS — Z87891 Personal history of nicotine dependence: Secondary | ICD-10-CM | POA: Insufficient documentation

## 2015-01-30 DIAGNOSIS — Y999 Unspecified external cause status: Secondary | ICD-10-CM | POA: Insufficient documentation

## 2015-01-30 DIAGNOSIS — Y288XXA Contact with other sharp object, undetermined intent, initial encounter: Secondary | ICD-10-CM | POA: Diagnosis not present

## 2015-01-30 DIAGNOSIS — Y939 Activity, unspecified: Secondary | ICD-10-CM | POA: Diagnosis not present

## 2015-01-30 DIAGNOSIS — I10 Essential (primary) hypertension: Secondary | ICD-10-CM | POA: Insufficient documentation

## 2015-01-30 DIAGNOSIS — S81812A Laceration without foreign body, left lower leg, initial encounter: Secondary | ICD-10-CM | POA: Diagnosis not present

## 2015-01-30 DIAGNOSIS — S8992XA Unspecified injury of left lower leg, initial encounter: Secondary | ICD-10-CM | POA: Diagnosis present

## 2015-01-30 DIAGNOSIS — Z8639 Personal history of other endocrine, nutritional and metabolic disease: Secondary | ICD-10-CM | POA: Diagnosis not present

## 2015-01-30 MED ORDER — LIDOCAINE-EPINEPHRINE 2 %-1:100000 IJ SOLN
INTRAMUSCULAR | Status: AC
  Filled 2015-01-30: qty 1

## 2015-01-30 MED ORDER — LIDOCAINE-EPINEPHRINE 2 %-1:100000 IJ SOLN: 20.0000 mL | Freq: Once | INTRAMUSCULAR | Status: DC

## 2015-01-30 NOTE — ED Provider Notes (Signed)
CSN: [id number]     Arrival date & time 01/30/15  1733 History   First MD Initiated Contact with Patient 01/30/15 1740     Chief Complaint  Patient presents with  . Extremity Laceration     (Consider location/radiation/quality/duration/timing/severity/associated sxs/prior Treatment) HPI Gary Zimmerman is a 54 y.o. male with history of hypertension, degenerative joint disease, prediabetes, presents to emergency department with left leg injury. Patient states he accidentally cut his left shin with a hatchet. Reports severe bleeding which is controlled with pressure at this time. Denies any numbness or weakness distal to the cut. Denies any other complaints. Tetanus is up-to-date.  Past Medical History  Diagnosis Date  . Asthma     no problems in years   . Allergic rhinitis   . Mild hyperlipidemia   . HTN (hypertension)   . Retinal detachment ~ 10-2012    sees eye MD  . DJD (degenerative joint disease) 10/31/2014  . Allergy     seasonal  . Prediabetes 12/25/2014   Past Surgical History  Procedure Laterality Date  . Hernia repair      multiple as a child  . Retinal laser procedure  2002     R , x 2   . Refractive surgery      lasik   Family History  Problem Relation Age of Onset  . Diabetes Sister   . CAD Other     ?F, uncle  . Stroke Other     cousin  . Colon cancer Neg Hx   . Prostate cancer Neg Hx   . Rectal cancer Neg Hx   . Stomach cancer Neg Hx   . Esophageal cancer Neg Hx   . Breast cancer Other     MGM   History  Substance Use Topics  . Smoking status: Former Games developermoker  . Smokeless tobacco: Former NeurosurgeonUser    Types: Chew     Comment:  on chantix  . Alcohol Use: 0.0 oz/week    0 Standard drinks or equivalent per week     Comment: socially     Review of Systems  Constitutional: Negative for fever and chills.  Musculoskeletal: Positive for arthralgias.  Skin: Positive for wound.  Neurological: Negative for weakness and numbness.  All other systems reviewed and are  negative.     Allergies  Review of patient's allergies indicates no known allergies.  Home Medications   Prior to Admission medications   Medication Sig Start Date End Date Taking? Authorizing Provider  amLODipine (NORVASC) 10 MG tablet TAKE 1 TABLET BY MOUTH EVERY DAY 06/24/14   Wanda PlumpJose E Paz, MD  cetirizine (ZYRTEC) 10 MG tablet Take 10 mg by mouth daily as needed.     Historical Provider, MD  fish oil-omega-3 fatty acids 1000 MG capsule Take 1 g by mouth daily.    Historical Provider, MD  fluticasone (FLONASE) 50 MCG/ACT nasal spray USE 2 SPRAYS NASALLY DAILY. Patient not taking: Reported on 10/31/2014 06/24/14   Wanda PlumpJose E Paz, MD  Multiple Vitamin (MULTIVITAMIN) tablet Take 1 tablet by mouth daily.    Historical Provider, MD  varenicline (CHANTIX STARTING MONTH PAK) 0.5 MG X 11 & 1 MG X 42 tablet Take one 0.5 mg tablet by mouth once daily for 3 days, then increase to one 0.5 mg tablet twice daily for 4 days, then increase to one 1 mg tablet twice daily. Patient not taking: Reported on 12/25/2014 10/31/14   Wanda PlumpJose E Paz, MD  varenicline (CHANTIX) 1 MG tablet Take  1 tablet (1 mg total) by mouth 2 (two) times daily. 10/31/14   Wanda Plump, MD   BP 180/91 mmHg  Pulse 82  Temp(Src) 98.4 F (36.9 C) (Oral)  Resp 18  Ht  (1.803 m)  Wt 225 lb (102.059 kg)  BMI 31.39 kg/m2  SpO2 99% Physical Exam  Constitutional: He appears well-developed and well-nourished. No distress.  Eyes: Conjunctivae are normal.  Cardiovascular: Normal rate, regular rhythm and normal heart sounds.   Pulmonary/Chest: Effort normal and breath sounds normal. No respiratory distress. He has no wheezes. He has no rales.  Musculoskeletal:  Full range of motion of the left ankle, all toes. Left foot is pink, warm. Cap refill less than 2 seconds to all toes  Neurological: He is alert.  Strength is intact with flexion and plantar flexion against resistance. Strength is intact of all toes with toe flexion and extension against  resistance. Sensation is intact and dorsal and plantar surface of the foot  Skin: Skin is warm and dry.  4 cm vertical laceration to the anterior mid shin with active small arterial bleed.  Nursing note and vitals reviewed.   ED Course  Procedures (including critical care time) Labs Review Labs Reviewed - No data to display  Imaging Review Dg Tibia/fibula Left  01/30/2015   CLINICAL DATA:  Laceration due to injury with hatchet  EXAM: LEFT TIBIA AND FIBULA - 2 VIEW  COMPARISON:  None.  FINDINGS: Frontal and lateral views were obtained. There is no demonstrable fracture or dislocation. A small calcification is noted posterior to the distal tibia, a finding of uncertain etiology but felt to be of benign etiology. There are spurs arising from the posterior inferior calcaneus. Joint spaces appear intact. No radiopaque foreign body.  IMPRESSION: No fracture or dislocation.  No radiopaque foreign body.   Electronically Signed   By: Bretta Bang III M.D.   On: 01/30/2015 20:10     EKG Interpretation None     LACERATION REPAIR Performed by: Jaynie Crumble A Authorized by: Jaynie Crumble A Consent: Verbal consent obtained. Risks and benefits: risks, benefits and alternatives were discussed Consent given by: patient Patient identity confirmed: provided demographic data Prepped and Draped in normal sterile fashion Wound explored  Laceration Location: left anterior shin  Laceration Length: 4cm  No Foreign Bodies seen or palpated  Anesthesia: local infiltration  Local anesthetic: lidocaine 2% w epinephrine  Anesthetic total: 4 ml  Irrigation method: syringe Amount of cleaning: standard  Skin closure: bleeding controled with 3 figure 8 sutures with vicril rapid 4.0. Prolene 3.0 used on the skin to close the wound  Number of sutures: 3 figure 8s, 6 skin  Technique: simple interrupted  Patient tolerance: Patient tolerated the procedure well with no immediate  complications.  MDM   Final diagnoses:  Laceration of lower leg, left, initial encounter   patient with a small arterial bleed from a laceration to the left lower leg. Bleeding was initially controlled with 3 figure-of-eight sutures. Patient is not on any blood thinners. Minimal blood loss at this time. Foot continues to be pink and warm with cap refill less than 2 seconds in all toes. X-ray of the shin obtained and is negative. Wound repaired with sutures. Patient monitored for an hour. He ambulated with no bleeding emergency department. Patient will be discharged home with pressure dressing, elevation of the leg. Follow-up as needed. Return precautions discussed.  Filed Vitals:   01/30/15 1737  BP: 180/91  Pulse: 82  Temp: 98.4 F (36.9 C)  TempSrc: Oral  Resp: 18  Height:  (1.803 m)  Weight: 225 lb (102.059 kg)  SpO2: 99%       Jaynie Crumble, PA-C 01/30/15 2229  Arby Barrette, MD 01/31/15 [phone]

## 2015-01-30 NOTE — ED Notes (Signed)
Pt reports cut leg with hatchet.  +pulses, sensation distal to injury.

## 2015-01-30 NOTE — Discharge Instructions (Signed)
Antibiotic ointment twice a day. Keep clean and covered. Keep pressure on and elevated for the next 24-48 hrs. Follow up as needed. Return if worsening bleeding or pain  Laceration Care, Adult A laceration is a cut or lesion that goes through all layers of the skin and into the tissue just beneath the skin. TREATMENT  Some lacerations may not require closure. Some lacerations may not be able to be closed due to an increased risk of infection. It is important to see your caregiver as soon as possible after an injury to minimize the risk of infection and maximize the opportunity for successful closure. If closure is appropriate, pain medicines may be given, if needed. The wound will be cleaned to help prevent infection. Your caregiver will use stitches (sutures), staples, wound glue (adhesive), or skin adhesive strips to repair the laceration. These tools bring the skin edges together to allow for faster healing and a better cosmetic outcome. However, all wounds will heal with a scar. Once the wound has healed, scarring can be minimized by covering the wound with sunscreen during the day for 1 full year. HOME CARE INSTRUCTIONS  For sutures or staples:  Keep the wound clean and dry.  If you were given a bandage (dressing), you should change it at least once a day. Also, change the dressing if it becomes wet or dirty, or as directed by your caregiver.  Wash the wound with soap and water 2 times a day. Rinse the wound off with water to remove all soap. Pat the wound dry with a clean towel.  After cleaning, apply a thin layer of the antibiotic ointment as recommended by your caregiver. This will help prevent infection and keep the dressing from sticking.  You may shower as usual after the first 24 hours. Do not soak the wound in water until the sutures are removed.  Only take over-the-counter or prescription medicines for pain, discomfort, or fever as directed by your caregiver.  Get your sutures or  staples removed as directed by your caregiver. For skin adhesive strips:  Keep the wound clean and dry.  Do not get the skin adhesive strips wet. You may bathe carefully, using caution to keep the wound dry.  If the wound gets wet, pat it dry with a clean towel.  Skin adhesive strips will fall off on their own. You may trim the strips as the wound heals. Do not remove skin adhesive strips that are still stuck to the wound. They will fall off in time. For wound adhesive:  You may briefly wet your wound in the shower or bath. Do not soak or scrub the wound. Do not swim. Avoid periods of heavy perspiration until the skin adhesive has fallen off on its own. After showering or bathing, gently pat the wound dry with a clean towel.  Do not apply liquid medicine, cream medicine, or ointment medicine to your wound while the skin adhesive is in place. This may loosen the film before your wound is healed.  If a dressing is placed over the wound, be careful not to apply tape directly over the skin adhesive. This may cause the adhesive to be pulled off before the wound is healed.  Avoid prolonged exposure to sunlight or tanning lamps while the skin adhesive is in place. Exposure to ultraviolet light in the first year will darken the scar.  The skin adhesive will usually remain in place for 5 to 10 days, then naturally fall off the skin. Do not  pick at the adhesive film. You may need a tetanus shot if:  You cannot remember when you had your last tetanus shot.  You have never had a tetanus shot. If you get a tetanus shot, your arm may swell, get red, and feel warm to the touch. This is common and not a problem. If you need a tetanus shot and you choose not to have one, there is a rare chance of getting tetanus. Sickness from tetanus can be serious. SEEK MEDICAL CARE IF:   You have redness, swelling, or increasing pain in the wound.  You see a red line that goes away from the wound.  You have  yellowish-white fluid (pus) coming from the wound.  You have a fever.  You notice a bad smell coming from the wound or dressing.  Your wound breaks open before or after sutures have been removed.  You notice something coming out of the wound such as wood or glass.  Your wound is on your hand or foot and you cannot move a finger or toe. SEEK IMMEDIATE MEDICAL CARE IF:   Your pain is not controlled with prescribed medicine.  You have severe swelling around the wound causing pain and numbness or a change in color in your arm, hand, leg, or foot.  Your wound splits open and starts bleeding.  You have worsening numbness, weakness, or loss of function of any joint around or beyond the wound.  You develop painful lumps near the wound or on the skin anywhere on your body. MAKE SURE YOU:   Understand these instructions.  Will watch your condition.  Will get help right away if you are not doing well or get worse. Document Released: 09/19/2005 Document Revised: 12/12/2011 Document Reviewed: 03/15/2011 [facility] Patient Information 2015 [place], [place]. This information is not intended to replace advice given to you by your health care provider. Make sure you discuss any questions you have with your health care provider.

## 2015-03-26 ENCOUNTER — Encounter: Payer: Self-pay | Admitting: Internal Medicine

## 2015-04-28 ENCOUNTER — Ambulatory Visit: Payer: BLUE CROSS/BLUE SHIELD | Admitting: Internal Medicine

## 2015-05-07 ENCOUNTER — Ambulatory Visit: Payer: BLUE CROSS/BLUE SHIELD | Admitting: Internal Medicine

## 2015-05-07 ENCOUNTER — Encounter: Payer: Self-pay | Admitting: Internal Medicine

## 2015-05-07 ENCOUNTER — Ambulatory Visit (INDEPENDENT_AMBULATORY_CARE_PROVIDER_SITE_OTHER): Payer: BLUE CROSS/BLUE SHIELD | Admitting: Internal Medicine

## 2015-05-07 VITALS — BP 122/76 | HR 55 | Temp 97.9°F | Ht 71.0 in | Wt 233.4 lb

## 2015-05-07 DIAGNOSIS — Z720 Tobacco use: Secondary | ICD-10-CM | POA: Diagnosis not present

## 2015-05-07 DIAGNOSIS — F329 Major depressive disorder, single episode, unspecified: Secondary | ICD-10-CM | POA: Diagnosis not present

## 2015-05-07 DIAGNOSIS — F419 Anxiety disorder, unspecified: Secondary | ICD-10-CM

## 2015-05-07 DIAGNOSIS — F32A Depression, unspecified: Secondary | ICD-10-CM

## 2015-05-07 MED ORDER — BUPROPION HCL 100 MG PO TABS: 100.0000 mg | ORAL_TABLET | Freq: Two times a day (BID) | ORAL | Status: DC

## 2015-05-07 NOTE — Progress Notes (Signed)
Pre visit review using our clinic review tool, if applicable. No additional management support is needed unless otherwise documented below in the visit note. 

## 2015-05-07 NOTE — Assessment & Plan Note (Signed)
Depressive mood started after he finished Chantix. Depression screen scored 13 which is moderate Depression. I recommended counseling, will also discuss Wellbutrin which could help with both depression and cravings after quitting tobacco. He agrees, will start Wellbutrin, see instructions, follow-up in 6 weeks.

## 2015-05-07 NOTE — Assessment & Plan Note (Addendum)
Recently finished Chantix, not chewing tobacco anymore.praised . Still having some cravings

## 2015-05-07 NOTE — Progress Notes (Signed)
Subjective:    Patient ID: Gary Zimmerman, male    DOB: March 24, 1961, 54 y.o.   MRN: [id number]  DOS:  05/07/2015 Type of visit - description : Follow-up Interval history: Recently finished Chantix and successfully quit chewing tobacco (has smoked a cigar one or 2 times). Since he finished Chantix he is feeling somewhat depressed, somewhat frustrated, not liking to do anything, in the dumps.    Review of Systems  stress increase lately due to his job, recently his son move back in and that is also stressful. Has gained some weight, was doing really well with walking but he simply stopped. No actual anxiety that he can tell.  Past Medical History  Diagnosis Date  . Asthma     no problems in years   . Allergic rhinitis   . Mild hyperlipidemia   . HTN (hypertension)   . Retinal detachment ~ 10-2012    sees eye MD  . DJD (degenerative joint disease) 10/31/2014  . Allergy     seasonal  . Prediabetes 12/25/2014    Past Surgical History  Procedure Laterality Date  . Hernia repair      multiple as a child  . Retinal laser procedure  2002     R , x 2   . Refractive surgery      lasik    History   Social History  . Marital Status: Married    Spouse Name: Aurther Loft  . Number of Children: 3  . Years of Education: N/A   Occupational History  . 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 Smoking status: Former Games developer  . Smokeless tobacco: Former Neurosurgeon    Types: Chew     Comment:  on chantix  . Alcohol Use: 0.0 oz/week    0 Standard drinks or equivalent per week     Comment: socially   . Drug Use: No  . Sexual Activity: Not on file   Other Topics Concern  . Not on file   Social History Narrative   Some college    2 sons, 1 daughter     [place] Ham from [place] to [place] 11-2011     son moved2016        Medication List       This list is accurate as of: 05/07/15 11:59 PM.  Always use your most recent med list.               amLODipine 10 MG tablet    Commonly known as:  NORVASC  TAKE 1 TABLET BY MOUTH EVERY DAY     buPROPion 100 MG tablet  Commonly known as:  WELLBUTRIN  Take 1 tablet (100 mg total) by mouth 2 (two) times daily.     cetirizine 10 MG tablet  Commonly known as:  ZYRTEC  Take 10 mg by mouth daily as needed.     fish oil-omega-3 fatty acids 1000 MG capsule  Take 1 g by mouth daily.     fluticasone 50 MCG/ACT nasal spray  Commonly known as:  FLONASE  USE 2 SPRAYS NASALLY DAILY.     multivitamin tablet  Take 1 tablet by mouth daily.           Objective:   Physical Exam BP 122/76 mmHg  Pulse 55  Temp(Src) 97.9 F (36.6 C) (Oral)  Ht 5\' 11"  (1.803 m)  Wt 233 lb 6 oz (105.858 kg)  BMI 32.56 kg/m2  SpO2 97% General:  Well developed, well nourished . NAD.  HEENT:  Normocephalic . Face symmetric, atraumatic Neurologic:  alert & oriented X3.  Speech normal, gait appropriate for age and unassisted Psych--  Cognition and judgment appear intact.  Cooperative with normal attention span and concentration.  Behavior appropriate. Mildly anxious but no depressed appearing.      Assessment & Plan:

## 2015-05-07 NOTE — Patient Instructions (Addendum)
wellbutrin 100 mg: 1 tablet daily for 10 days, then one tablet twice a day

## 2015-06-22 ENCOUNTER — Encounter: Payer: Self-pay | Admitting: *Deleted

## 2015-06-22 ENCOUNTER — Telehealth: Payer: Self-pay | Admitting: *Deleted

## 2015-06-22 NOTE — Telephone Encounter (Signed)
Pre-Visit Call completed with patient and chart updated.   Pre-Visit Info documented in Specialty Comments under [employer].    

## 2015-06-22 NOTE — Addendum Note (Signed)
Addended by: Noreene Larsson A on: 06/22/2015 04:02 PM   Modules accepted: Medications

## 2015-06-22 NOTE — Telephone Encounter (Signed)
Unable to reach patient at time of Pre-Visit Call.  Left message for patient to return call when available.    

## 2015-06-24 ENCOUNTER — Ambulatory Visit (INDEPENDENT_AMBULATORY_CARE_PROVIDER_SITE_OTHER): Payer: BLUE CROSS/BLUE SHIELD | Admitting: Internal Medicine

## 2015-06-24 ENCOUNTER — Encounter: Payer: Self-pay | Admitting: Internal Medicine

## 2015-06-24 VITALS — BP 122/70 | HR 59 | Temp 98.0°F | Ht 71.0 in | Wt 234.2 lb

## 2015-06-24 DIAGNOSIS — Z23 Encounter for immunization: Secondary | ICD-10-CM

## 2015-06-24 DIAGNOSIS — E785 Hyperlipidemia, unspecified: Secondary | ICD-10-CM

## 2015-06-24 DIAGNOSIS — Z0000 Encounter for general adult medical examination without abnormal findings: Secondary | ICD-10-CM | POA: Diagnosis not present

## 2015-06-24 DIAGNOSIS — Z1159 Encounter for screening for other viral diseases: Secondary | ICD-10-CM

## 2015-06-24 DIAGNOSIS — Z09 Encounter for follow-up examination after completed treatment for conditions other than malignant neoplasm: Secondary | ICD-10-CM

## 2015-06-24 LAB — LIPID PANEL
CHOLESTEROL: 210 mg/dL — AB (ref 0–200)
HDL: 40.3 mg/dL (ref >39.00)
LDL Cholesterol: 133 mg/dL — ABNORMAL HIGH (ref 0–99)
NonHDL: 169.65
Total CHOL/HDL Ratio: 5
Triglycerides: 184 mg/dL — ABNORMAL HIGH (ref 0.0–149.0)
VLDL: 36.8 mg/dL (ref 0.0–40.0)

## 2015-06-24 LAB — BASIC METABOLIC PANEL
BUN: 12 mg/dL (ref 6–23)
CHLORIDE: 102 meq/L (ref 96–112)
CO2: 31 meq/L (ref 19–32)
Calcium: 9.3 mg/dL (ref 8.4–10.5)
Creatinine, Ser: 1.03 mg/dL (ref 0.40–1.50)
GFR: 80.24 mL/min (ref >60.00)
Glucose, Bld: 128 mg/dL — ABNORMAL HIGH (ref 70–99)
Potassium: 4.1 mEq/L (ref 3.5–5.1)
Sodium: 138 mEq/L (ref 135–145)

## 2015-06-24 LAB — HEMOGLOBIN A1C: Hgb A1c MFr Bld: 6.4 % (ref 4.6–6.5)

## 2015-06-24 LAB — AST: AST: 25 U/L (ref 0–37)

## 2015-06-24 LAB — ALT: ALT: 36 U/L (ref 0–53)

## 2015-06-24 NOTE — Patient Instructions (Signed)
Get your blood work before you leave     Next visit  for a   routine checkup in 6 months, no fasting (15 minutes) Please schedule an appointment at the front desk  

## 2015-06-24 NOTE — Progress Notes (Signed)
Subjective:    Patient ID: Gary Zimmerman, male    DOB: Jul 11, 1961, 54 y.o.   MRN: [id number]  DOS:  06/24/2015 Type of visit - description : CPX Interval history: Feeling well, no major concerns, good compliance of medication   Review of Systems  Constitutional: No fever. No chills. No unexplained wt changes. No unusual sweats  HEENT: No dental problems, no ear discharge, no facial swelling, no voice changes. No eye discharge, no eye  redness , no  intolerance to light   Respiratory: No wheezing , no  difficulty breathing. No cough , no mucus production  Cardiovascular: No CP, no leg swelling , no  Palpitations  GI: no nausea, no vomiting, no diarrhea , no  abdominal pain.  No blood in the stools. No dysphagia, no odynophagia    Endocrine: No polyphagia, no polyuria , no polydipsia  GU: No dysuria, gross hematuria, difficulty urinating. No urinary urgency, no frequency.  Musculoskeletal: No joint swellings or unusual aches or pains  Skin: No change in the color of the skin, palor , no  Rash  Allergic, immunologic: No environmental allergies , no  food allergies  Neurological: No dizziness no  syncope. No headaches. No diplopia, no slurred, no slurred speech, no motor deficits, no facial  Numbness  Hematological: No enlarged lymph nodes, no easy bruising , no unusual bleedings  Psychiatry: No suicidal ideas, no hallucinations, no beavior problems, no confusion.  On Wellbutrin, symptoms well-controlled, + the stress at work, some stress at home (his son moved back in)  Past Medical History  Diagnosis Date  . Asthma     no problems in years   . Allergic rhinitis   . Mild hyperlipidemia   . HTN (hypertension)   . Retinal detachment ~ 10-2012    sees eye MD  . DJD (degenerative joint disease) 10/31/2014  . Allergy     seasonal  . Prediabetes 12/25/2014    Past Surgical History  Procedure Laterality Date  . Hernia repair      multiple as a child  . Retinal laser  procedure  2002     R , x 2   . Refractive surgery      lasik    Social History   Social History  . Marital Status: Married    Spouse Name: Aurther Loft  . Number of Children: 3  . Years of Education: N/A   Occupational History  . 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 Smoking status: Former Games developer  . Smokeless tobacco: Former Neurosurgeon    Types: Chew  . Alcohol Use: 0.0 oz/week    0 Standard drinks or equivalent per week     Comment: socially   . Drug Use: No  . Sexual Activity: Not on file   Other Topics Concern  . Not on file   Social History Narrative   Some college    2 sons, 1 daughter     [place] Ham from [place] to [place] 11-2011     son moved in home w/ them 2016   Family History  Problem Relation Age of Onset  . Diabetes Sister   . CAD Other     ?F, uncle  . Stroke Other     cousin  . Colon cancer Neg Hx   . Prostate cancer Neg Hx   . Rectal cancer Neg Hx   . Stomach cancer Neg Hx   . Esophageal cancer Neg Hx   .  Breast cancer Other     MGM         Medication List       This list is accurate as of: 06/24/15 11:59 PM.  Always use your most recent med list.               amLODipine 10 MG tablet  Commonly known as:  NORVASC  TAKE 1 TABLET BY MOUTH EVERY DAY     buPROPion 100 MG tablet  Commonly known as:  WELLBUTRIN  Take 1 tablet (100 mg total) by mouth 2 (two) times daily.     cetirizine 10 MG tablet  Commonly known as:  ZYRTEC  Take 10 mg by mouth daily as needed.     fish oil-omega-3 fatty acids 1000 MG capsule  Take 1 g by mouth daily.     fluticasone 50 MCG/ACT nasal spray  Commonly known as:  FLONASE  USE 2 SPRAYS NASALLY DAILY.     multivitamin tablet  Take 1 tablet by mouth daily.           Objective:   Physical Exam BP 122/70 mmHg  Pulse 59  Temp(Src) 98 F (36.7 C) (Oral)  Ht  (1.803 m)  Wt 234 lb 4 oz (106.255 kg)  BMI 32.69 kg/m2  SpO2 98% General:   Well developed, well nourished . NAD.    HEENT:  Normocephalic . Face symmetric, atraumatic Neck: No thyromegaly Lungs:  CTA B Normal respiratory effort, no intercostal retractions, no accessory muscle use. Heart: RRR,  no murmur.  no pretibial edema bilaterally  Abdomen:  Not distended, soft, non-tender. No rebound or rigidity. No mass,organomegaly Skin: Not pale. Not jaundice Neurologic:  alert & oriented X3.  Speech normal, gait appropriate for age and unassisted Psych--  Cognition and judgment appear intact.  Cooperative with normal attention span and concentration.  Behavior appropriate. No anxious or depressed appearing.    Assessment & Plan:  A/P: Depression: Started Wellbutrin 05-2015, symptoms improved Tobacco abuse: no further use of dipping, was having cravings, symptoms decrease when he started Wellbutrin 05-2015 Hypertension: Seems well-controlled, continue amlodipine Prediabetes: Labs today, last A1c 6.4. Diet-exercise discussed

## 2015-06-24 NOTE — Assessment & Plan Note (Addendum)
Td 2015, flu shot : today CCS:  cscope 12-2014-- tics, 10 years  Prostate cancer screening: DRE 06-2014 neg  PSA: 06/26/14- 0.30  Diet and exercise discussed Labs  : BMP, AST, ALT, FLP, A1c, hep C

## 2015-06-24 NOTE — Progress Notes (Signed)
Pre visit review using our clinic review tool, if applicable. No additional management support is needed unless otherwise documented below in the visit note. 

## 2015-06-25 DIAGNOSIS — Z09 Encounter for follow-up examination after completed treatment for conditions other than malignant neoplasm: Secondary | ICD-10-CM | POA: Insufficient documentation

## 2015-06-25 LAB — HEPATITIS C ANTIBODY: HCV Ab: NEGATIVE

## 2015-06-25 NOTE — Assessment & Plan Note (Signed)
Depression: Started Wellbutrin 05-2015, symptoms improved Tobacco abuse: no further use of dipping, was having cravings, symptoms decrease when he started Wellbutrin 05-2015 Hypertension: Seems well-controlled, continue amlodipine Prediabetes: Labs today, last A1c 6.4. Diet-exercise discussed

## 2015-06-29 MED ORDER — ATORVASTATIN CALCIUM 10 MG PO TABS: 10.0000 mg | ORAL_TABLET | Freq: Every day | ORAL | Status: DC

## 2015-06-29 NOTE — Addendum Note (Signed)
Addended by: Dorette Grate on: 06/29/2015 09:34 AM   Modules accepted: Orders

## 2015-07-06 ENCOUNTER — Other Ambulatory Visit: Payer: Self-pay | Admitting: Internal Medicine

## 2015-07-20 ENCOUNTER — Other Ambulatory Visit: Payer: Self-pay | Admitting: Internal Medicine

## 2015-10-27 MED FILL — ATORVASTATIN 10 MG TABLET: 10 | 30 days supply | Qty: 30 | Fill #3

## 2015-10-27 MED FILL — AMLODIPINE BESYLATE 10 MG T: 10 | 90 days supply | Qty: 90 | Fill #1

## 2015-12-22 ENCOUNTER — Encounter: Payer: Self-pay | Admitting: Internal Medicine

## 2015-12-22 ENCOUNTER — Ambulatory Visit (INDEPENDENT_AMBULATORY_CARE_PROVIDER_SITE_OTHER): Payer: BLUE CROSS/BLUE SHIELD | Admitting: Internal Medicine

## 2015-12-22 VITALS — BP 142/78 | HR 74 | Temp 98.1°F | Resp 16 | Ht 71.0 in | Wt 235.4 lb

## 2015-12-22 DIAGNOSIS — J302 Other seasonal allergic rhinitis: Secondary | ICD-10-CM | POA: Diagnosis not present

## 2015-12-22 DIAGNOSIS — E785 Hyperlipidemia, unspecified: Secondary | ICD-10-CM | POA: Diagnosis not present

## 2015-12-22 DIAGNOSIS — Z09 Encounter for follow-up examination after completed treatment for conditions other than malignant neoplasm: Secondary | ICD-10-CM

## 2015-12-22 DIAGNOSIS — R739 Hyperglycemia, unspecified: Secondary | ICD-10-CM

## 2015-12-22 DIAGNOSIS — I10 Essential (primary) hypertension: Secondary | ICD-10-CM

## 2015-12-22 LAB — HEMOGLOBIN A1C: HEMOGLOBIN A1C: 6.8 % — AB (ref 4.6–6.5)

## 2015-12-22 MED ORDER — AZELASTINE HCL 0.1 % NA SOLN: 2.0000 | Freq: Every day | NASAL | Status: DC

## 2015-12-22 MED ORDER — FLUTICASONE PROPIONATE 50 MCG/ACT NA SUSP: 2.0000 | Freq: Every day | NASAL | Status: DC

## 2015-12-22 MED ORDER — PRAVASTATIN SODIUM 20 MG PO TABS
20.0000 mg | ORAL_TABLET | Freq: Every day | ORAL | Status: DC
Start: 2015-12-22 — End: 2016-05-30

## 2015-12-22 MED ORDER — AMLODIPINE BESYLATE 10 MG PO TABS: 10.0000 mg | ORAL_TABLET | Freq: Every day | ORAL | Status: DC

## 2015-12-22 MED ORDER — BUPROPION HCL 100 MG PO TABS: 100.0000 mg | ORAL_TABLET | Freq: Every day | ORAL | Status: DC

## 2015-12-22 MED FILL — buPROPion HCL 100 MG TABS: 100 | 90 days supply | Qty: 90 | Fill #0

## 2015-12-22 MED FILL — FLUTICASONE PROP 50 MCG SPR: 50 | 90 days supply | Qty: 48 | Fill #0

## 2015-12-22 MED FILL — PRAVASTATIN NA 20 MG TAB: 20 | 90 days supply | Qty: 90 | Fill #0

## 2015-12-22 MED FILL — AZELASTINE 0.1% (137 MCG) S: 0.1 | 90 days supply | Qty: 90 | Fill #0

## 2015-12-22 NOTE — Assessment & Plan Note (Signed)
Prediabetes: Diet and exercise discussed, check A1c in 6 weeks  HTN: Well-controlled, refill amlodipine Hyperlipidemia: Lipitor intolerance, trial with Pravachol. Labs in 6 weeks Allergies: Continue with mild symptoms despite Zyrtec and Flonase, add Astelin RTC 4 months, see instructions.

## 2015-12-22 NOTE — Progress Notes (Signed)
Subjective:    Patient ID: Gary Zimmerman, male    DOB: 1961-02-21, 55 y.o.   MRN: [id number]  DOS:  12/22/2015 Type of visit - description : Follow-up Interval history:  prediabetes: States is not exercising enough. no amb CBGs Anxiety depression: Self decrease Wellbutrin to one tablet daily, feeling well, started a new job, remains optimistic. High cholesterol: Tried Lipitor for 2 weeks, was unable to sleep, self d/c it, alternatives?Marland Kitchen. HTN:  ambulatory BPs consistently less than 140. Today slightly high. Good  Amlodipine compliance Complaining of increased allergy symptoms with postnasal dripping and constantly clearing his throat  Review of Systems Denies fever chills Occasionally itchy eyes but not itchy nose. No cough, sputum production or chest congestion  Past Medical History  Diagnosis Date  . Asthma     no problems in years   . Allergic rhinitis   . Mild hyperlipidemia   . HTN (hypertension)   . Retinal detachment ~ 10-2012    sees eye MD  . DJD (degenerative joint disease) 10/31/2014  . Allergy     seasonal  . Prediabetes 12/25/2014    Past Surgical History  Procedure Laterality Date  . Hernia repair      multiple as a child  . Retinal laser procedure  2002     R , x 2   . Refractive surgery      lasik    Social History   Social History  . Marital Status: Married    Spouse Name: Aurther Lofterry  . Number of Children: 3  . Years of Education: N/A   Occupational History  . 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 Smoking status: Former Games developermoker  . Smokeless tobacco: Former NeurosurgeonUser    Types: Chew  . Alcohol Use: 0.0 oz/week    0 Standard drinks or equivalent per week     Comment: socially   . Drug Use: No  . Sexual Activity: Not on file   Other Topics Concern  . Not on file   Social History Narrative   Some college    2 sons, 1 daughter     [place]     son moved in home w/ them 2016        Medication List         This list is accurate as of: 12/22/15  7:16 PM.  Always use your most recent med list.               amLODipine 10 MG tablet  Commonly known as:  NORVASC  Take 1 tablet (10 mg total) by mouth daily.     azelastine 0.1 % nasal spray  Commonly known as:  ASTELIN  Place 2 sprays into both nostrils at bedtime. Use in each nostril as directed     buPROPion 100 MG tablet  Commonly known as:  WELLBUTRIN  Take 1 tablet (100 mg total) by mouth daily.     cetirizine 10 MG tablet  Commonly known as:  ZYRTEC  Take 10 mg by mouth daily as needed.     fish oil-omega-3 fatty acids 1000 MG capsule  Take 1 g by mouth daily.     fluticasone 50 MCG/ACT nasal spray  Commonly known as:  FLONASE  Place 2 sprays into both nostrils daily.     multivitamin tablet  Take 1 tablet by mouth daily.     pravastatin 20 MG tablet  Commonly  known as:  PRAVACHOL  Take 1 tablet (20 mg total) by mouth daily.           Objective:   Physical Exam BP 142/78 mmHg  Pulse 74  Temp(Src) 98.1 F (36.7 C) (Oral)  Resp 16  Ht  (1.803 m)  Wt 235 lb 6 oz (106.765 kg)  BMI 32.84 kg/m2 General:   Well developed, well nourished . NAD.  HEENT:  Normocephalic . Face symmetric, atraumatic Lungs:  CTA B Normal respiratory effort, no intercostal retractions, no accessory muscle use. Heart: RRR,  no murmur.  no pretibial edema bilaterally  Abdomen:  Not distended, soft, non-tender. No rebound or rigidity.  Skin: Not pale. Not jaundice Neurologic:  alert & oriented X3.  Speech normal, gait appropriate for age and unassisted Psych--  Cognition and judgment appear intact.  Cooperative with normal attention span and concentration.  Behavior appropriate. No anxious or depressed appearing.    Assessment & Plan:   Assessment Prediabetes 12-2014 HTN Hyperlipidemia DJD H/o  asthma H/o retinal detachment 2014  Plan: Prediabetes: Diet and exercise discussed, check A1c in 6 weeks  HTN:  Well-controlled, refill amlodipine Hyperlipidemia: Lipitor intolerance, trial with Pravachol. Labs in 6 weeks Allergies: Continue with mild symptoms despite Zyrtec and Flonase, add Astelin RTC 4 months, see instructions.

## 2015-12-22 NOTE — Progress Notes (Signed)
Pre visit review using our clinic review tool, if applicable. No additional management support is needed unless otherwise documented below in the visit note. 

## 2015-12-22 NOTE — Patient Instructions (Addendum)
GO TO THE LAB :      Get the blood work     GO TO THE FRONT DESK  Schedule labs to be done in 6 weeks (FLP AST ALT), fasting  Schedule your next appointment for a  Follow up When?   4-5 months  Fasting?  No   Check the  blood pressure 2 or 3 times a month   Be sure your blood pressure is between 110/65 and  145/85. If it is consistently higher or lower, let me know   For allergies: zyrtec, flonase in the morning and astelin at night

## 2016-02-02 ENCOUNTER — Other Ambulatory Visit (INDEPENDENT_AMBULATORY_CARE_PROVIDER_SITE_OTHER): Payer: BLUE CROSS/BLUE SHIELD

## 2016-02-02 DIAGNOSIS — E785 Hyperlipidemia, unspecified: Secondary | ICD-10-CM

## 2016-02-02 LAB — ALT: ALT: 31 U/L (ref 0–53)

## 2016-02-02 LAB — LIPID PANEL
CHOL/HDL RATIO: 5
CHOLESTEROL: 185 mg/dL (ref 0–200)
HDL: 39.5 mg/dL (ref >39.00)
NonHDL: 145.65
Triglycerides: 213 mg/dL — ABNORMAL HIGH (ref 0.0–149.0)
VLDL: 42.6 mg/dL — AB (ref 0.0–40.0)

## 2016-02-02 LAB — LDL CHOLESTEROL, DIRECT: LDL DIRECT: 119 mg/dL

## 2016-02-02 LAB — AST: AST: 22 U/L (ref 0–37)

## 2016-02-08 MED FILL — AMLODIPINE BESYLATE 10 MG T: 10 | 90 days supply | Qty: 90 | Fill #2

## 2016-04-06 MED FILL — PRAVASTATIN NA 20 MG TAB: 20 | 90 days supply | Qty: 90 | Fill #1

## 2016-04-06 MED FILL — buPROPion HCL 100 MG TABS: 100 | 90 days supply | Qty: 90 | Fill #1

## 2016-04-20 ENCOUNTER — Telehealth: Payer: Self-pay | Admitting: Internal Medicine

## 2016-04-21 NOTE — Telephone Encounter (Signed)
Complete

## 2016-04-27 ENCOUNTER — Ambulatory Visit: Payer: BLUE CROSS/BLUE SHIELD | Admitting: Internal Medicine

## 2016-05-30 ENCOUNTER — Ambulatory Visit (INDEPENDENT_AMBULATORY_CARE_PROVIDER_SITE_OTHER): Payer: PRIVATE HEALTH INSURANCE | Admitting: Internal Medicine

## 2016-05-30 ENCOUNTER — Encounter: Payer: Self-pay | Admitting: Internal Medicine

## 2016-05-30 VITALS — BP 124/70 | HR 69 | Temp 98.1°F | Resp 14 | Ht 71.0 in | Wt 236.0 lb

## 2016-05-30 DIAGNOSIS — E785 Hyperlipidemia, unspecified: Secondary | ICD-10-CM

## 2016-05-30 DIAGNOSIS — E119 Type 2 diabetes mellitus without complications: Secondary | ICD-10-CM

## 2016-05-30 DIAGNOSIS — I10 Essential (primary) hypertension: Secondary | ICD-10-CM | POA: Diagnosis not present

## 2016-05-30 LAB — BASIC METABOLIC PANEL
BUN: 10 mg/dL (ref 6–23)
CALCIUM: 9.3 mg/dL (ref 8.4–10.5)
CO2: 27 meq/L (ref 19–32)
Chloride: 101 mEq/L (ref 96–112)
Creatinine, Ser: 1.03 mg/dL (ref 0.40–1.50)
GFR: 79.95 mL/min (ref >60.00)
GLUCOSE: 117 mg/dL — AB (ref 70–99)
POTASSIUM: 4 meq/L (ref 3.5–5.1)
SODIUM: 137 meq/L (ref 135–145)

## 2016-05-30 LAB — CBC WITH DIFFERENTIAL/PLATELET
Basophils Absolute: 0 10*3/uL (ref 0.0–0.1)
Basophils Relative: 0.4 % (ref 0.0–3.0)
EOS PCT: 2.2 % (ref 0.0–5.0)
Eosinophils Absolute: 0.2 10*3/uL (ref 0.0–0.7)
HCT: 46.3 % (ref 39.0–52.0)
Hemoglobin: 15.9 g/dL (ref 13.0–17.0)
LYMPHS ABS: 1.5 10*3/uL (ref 0.7–4.0)
Lymphocytes Relative: 15.5 % (ref 12.0–46.0)
MCHC: 34.4 g/dL (ref 30.0–36.0)
MCV: 87.6 fl (ref 78.0–100.0)
MONO ABS: 0.4 10*3/uL (ref 0.1–1.0)
MONOS PCT: 4.6 % (ref 3.0–12.0)
NEUTROS ABS: 7.5 10*3/uL (ref 1.4–7.7)
NEUTROS PCT: 77.3 % — AB (ref 43.0–77.0)
PLATELETS: 195 10*3/uL (ref 150.0–400.0)
RBC: 5.29 Mil/uL (ref 4.22–5.81)
RDW: 12 % (ref 11.5–15.5)
WBC: 9.7 10*3/uL (ref 4.0–10.5)

## 2016-05-30 LAB — HEMOGLOBIN A1C: Hgb A1c MFr Bld: 6.3 % (ref 4.6–6.5)

## 2016-05-30 MED ORDER — AZELASTINE HCL 0.1 % NA SOLN: 2.0000 | Freq: Every day | NASAL | 1 refills | Status: DC

## 2016-05-30 MED ORDER — PRAVASTATIN SODIUM 20 MG PO TABS: 20.0000 mg | ORAL_TABLET | Freq: Every day | ORAL | 1 refills | Status: DC

## 2016-05-30 MED ORDER — AMLODIPINE BESYLATE 10 MG PO TABS: 10.0000 mg | ORAL_TABLET | Freq: Every day | ORAL | 1 refills | Status: DC

## 2016-05-30 MED ORDER — BUPROPION HCL 100 MG PO TABS: 100.0000 mg | ORAL_TABLET | Freq: Every day | ORAL | 1 refills | Status: DC

## 2016-05-30 MED FILL — AMLODIPINE BESYLATE 10 MG T: 10 | 90 days supply | Qty: 90 | Fill #0

## 2016-05-30 MED FILL — AZELASTINE 0.1% (137 MCG) S: 0.1 | 90 days supply | Qty: 90 | Fill #0

## 2016-05-30 NOTE — Patient Instructions (Signed)
GO TO THE LAB : Get the blood work     GO TO THE FRONT DESK Schedule your next appointment for a physical in 4 to 6 months

## 2016-05-30 NOTE — Assessment & Plan Note (Signed)
DM: Has definitely improved his lifestyle, check an A1c HTN: Controlled, check a BMP and CBC Hyperlipidemia: Started a low-dose Pravachol, LDL improved, closer to goal. No change URI: Recommend observation, call if not improving RTC 4-6 months, CPX

## 2016-05-30 NOTE — Progress Notes (Signed)
Subjective:    Patient ID: Gary Zimmerman, male    DOB: 24-May-1961, 55 y.o.   MRN: [id number]  DOS:  05/30/2016 Type of visit - description : Routine office visit Interval history: Has improved his lifestyle, walking consistently 6-8 miles weekly, feels great. Developed a sore throat and left ear ache yesterday. Denies cough, fever or chills.   Review of Systems Denies chest pain or difficulty breathing No nausea, vomiting or diarrhea  Past Medical History:  Diagnosis Date  . Allergic rhinitis   . Allergy    seasonal  . Asthma    no problems in years   . DJD (degenerative joint disease) 10/31/2014  . HTN (hypertension)   . Mild hyperlipidemia   . Prediabetes 12/25/2014  . Retinal detachment ~ 10-2012   sees eye MD    Past Surgical History:  Procedure Laterality Date  . HERNIA REPAIR     multiple as a child  . REFRACTIVE SURGERY     lasik  . RETINAL LASER PROCEDURE  2002    R , x 2     Social History   Social History  . Marital status: Married    Spouse name: Aurther Lofterry  . Number of children: 3  . Years of education: N/A   Occupational History  . 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 Smoking status: Former Games developermoker  . Smokeless tobacco: Former NeurosurgeonUser    Types: Chew  . Alcohol use 0.0 oz/week     Comment: socially   . Drug use: No  . Sexual activity: Not on file   Other Topics Concern  . Not on file   Social History Narrative   Some college    2 sons, 1 daughter     [place]     son moved in home w/ them 2016        Medication List       Accurate as of 05/30/16  5:48 PM. Always use your most recent med list.          amLODipine 10 MG tablet Commonly known as:  NORVASC Take 1 tablet (10 mg total) by mouth daily.   azelastine 0.1 % nasal spray Commonly known as:  ASTELIN Place 2 sprays into both nostrils at bedtime. Use in each nostril as directed   buPROPion 100 MG tablet Commonly known as:   WELLBUTRIN Take 1 tablet (100 mg total) by mouth daily.   cetirizine 10 MG tablet Commonly known as:  ZYRTEC Take 10 mg by mouth daily as needed.   fish oil-omega-3 fatty acids 1000 MG capsule Take 1 g by mouth daily.   fluticasone 50 MCG/ACT nasal spray Commonly known as:  FLONASE Place 2 sprays into both nostrils daily.   multivitamin tablet Take 1 tablet by mouth daily.   pravastatin 20 MG tablet Commonly known as:  PRAVACHOL Take 1 tablet (20 mg total) by mouth daily.          Objective:   Physical Exam BP 124/70 (BP Location: Left Arm, Patient Position: Sitting, Cuff Size: Normal)   Pulse 69   Temp 98.1 F (36.7 C) (Oral)   Resp 14   Ht 5\' 11"  (1.803 m)   Wt 236 lb (107 kg)   SpO2 97%   BMI 32.92 kg/m  General:   Well developed, well nourished . NAD.  HEENT:  Normocephalic . Face symmetric, atraumatic. Unable to see the right  TM due to wax, left TM normal (slightly bulge). Throat symmetric, minimal redness, no discharge Lungs:  CTA B Normal respiratory effort, no intercostal retractions, no accessory muscle use. Heart: RRR,  no murmur.  Trace pretibial edema bilaterally  Skin: Not pale. Not jaundice Neurologic:  alert & oriented X3.  Speech normal, gait appropriate for age and unassisted Psych--  Cognition and judgment appear intact.  Cooperative with normal attention span and concentration.  Behavior appropriate. No anxious or depressed appearing.      Assessment & Plan:   Assessment DM: A1c increased to 6.8 12-2015 HTN Hyperlipidemia Tobacco-- took chantix, now on wellbutrin DJD H/o  asthma H/o retinal detachment 2014   PLAN: DM: Has definitely improved his lifestyle, check an A1c HTN: Controlled, check a BMP and CBC Hyperlipidemia: Started a low-dose Pravachol, LDL improved, closer to goal. No change URI: Recommend observation, call if not improving RTC 4-6 months, CPX

## 2016-05-30 NOTE — Progress Notes (Signed)
Pre visit review using our clinic review tool, if applicable. No additional management support is needed unless otherwise documented below in the visit note. 

## 2016-06-02 IMAGING — DX DG TIBIA/FIBULA 2V*L*
4 series · 4 of 4 positions shown · non-contrast
Comparison: None.

CLINICAL DATA: Laceration due to injury with hatchet

EXAM:
LEFT TIBIA AND FIBULA - 2 VIEW

[tibia ap (1 of 2)]
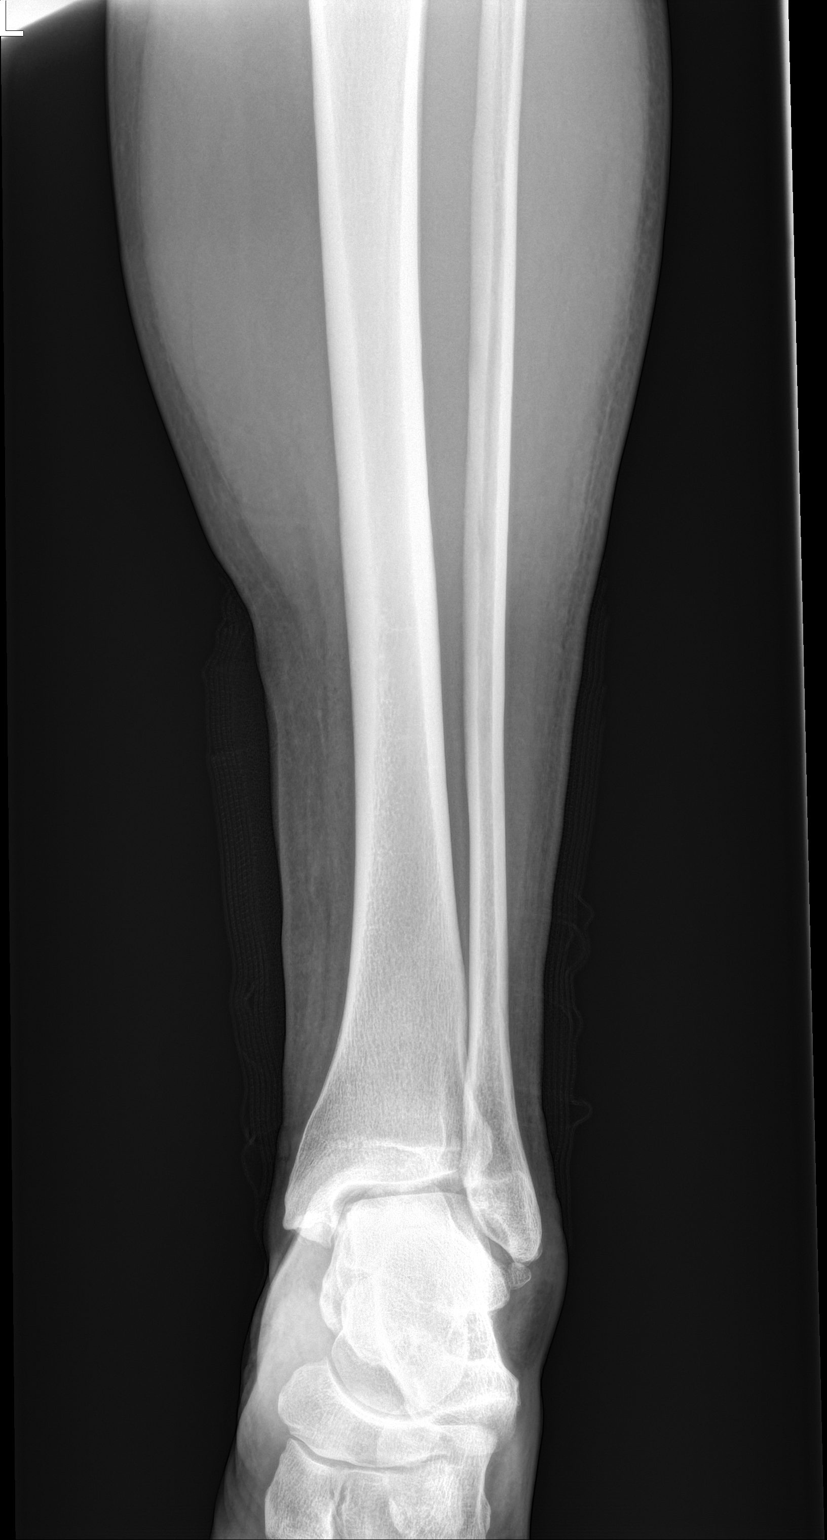

[tibia ap (2 of 2)]
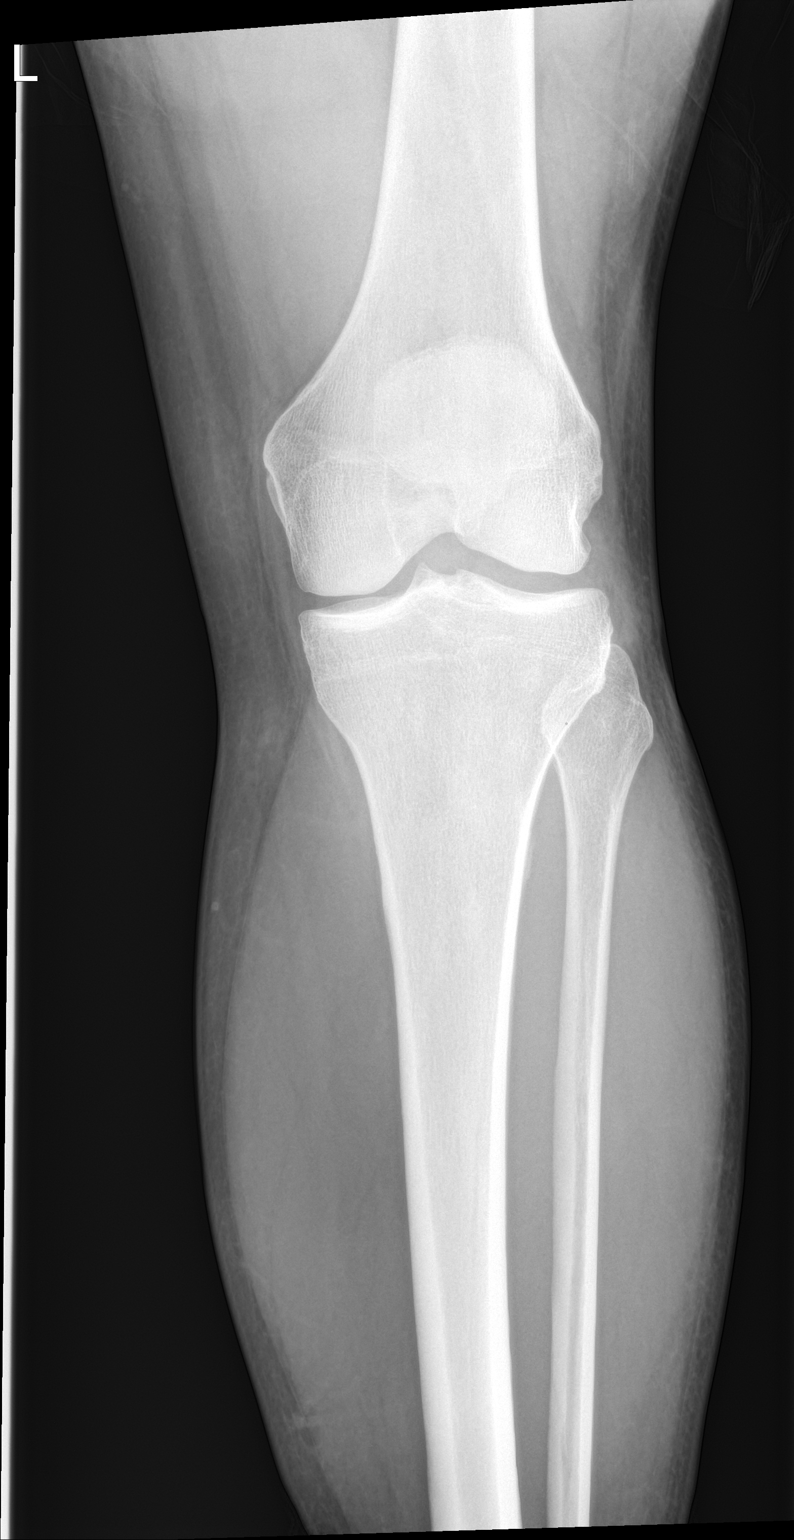

[tibia lat (1 of 2)]
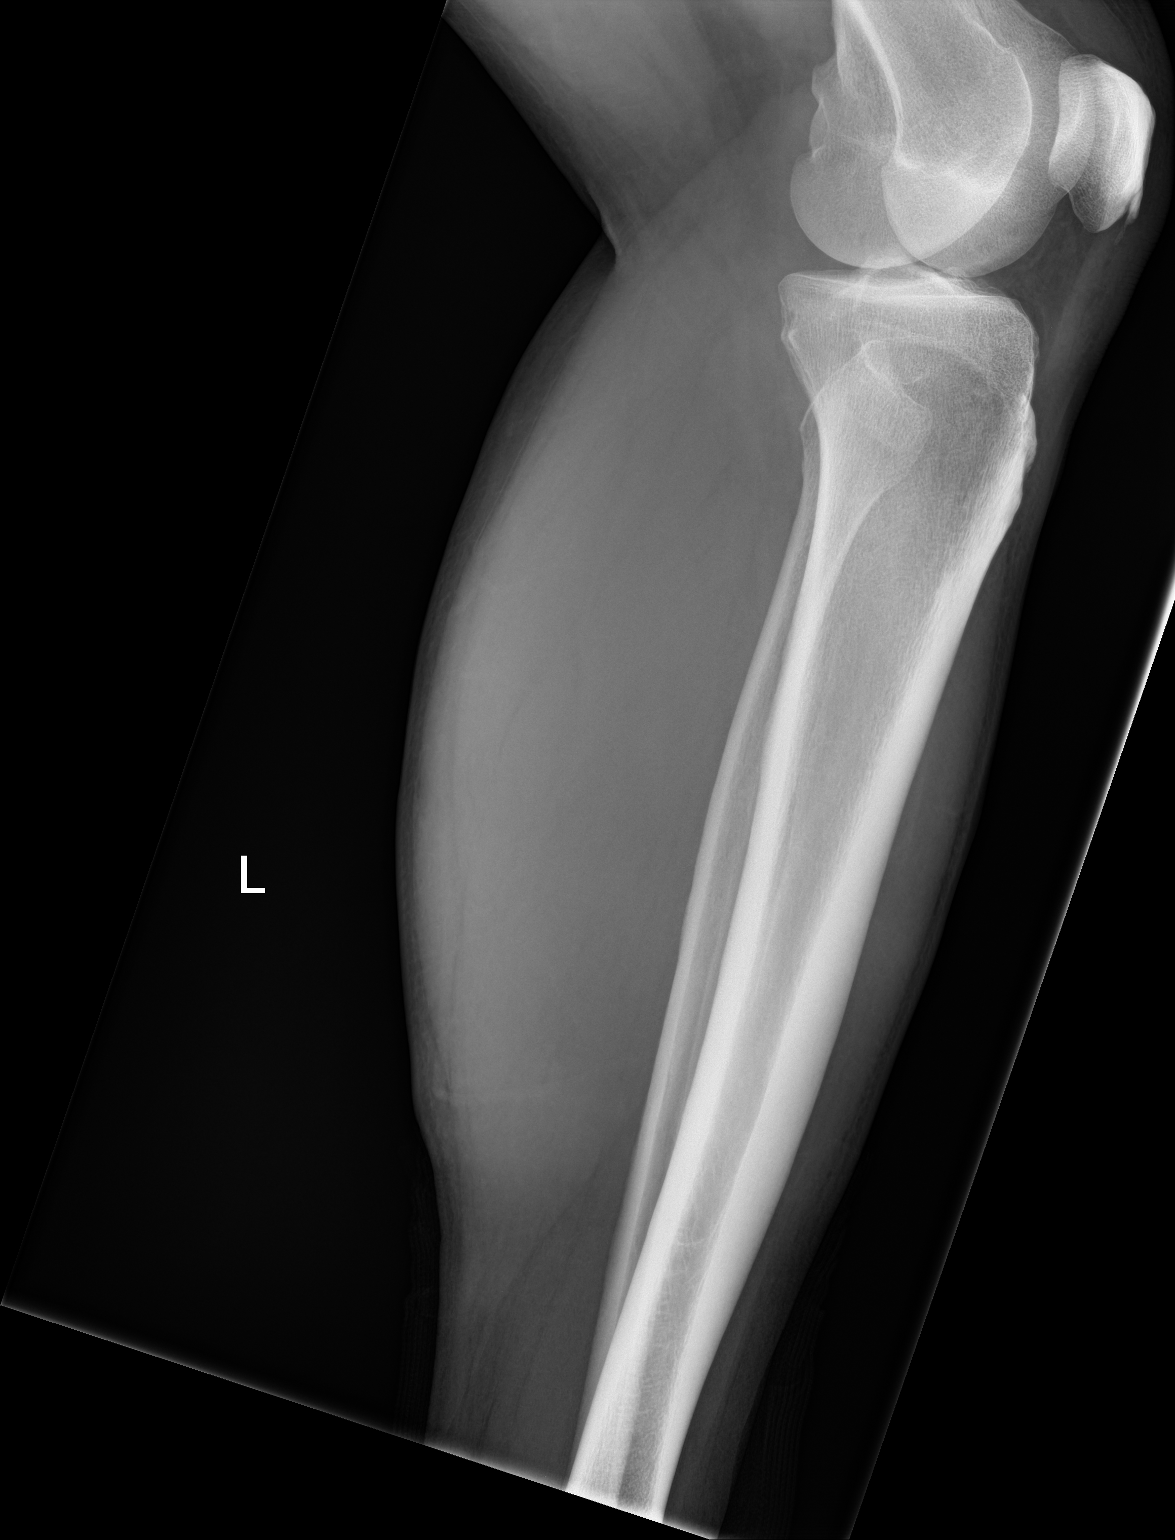

[tibia lat (2 of 2)]
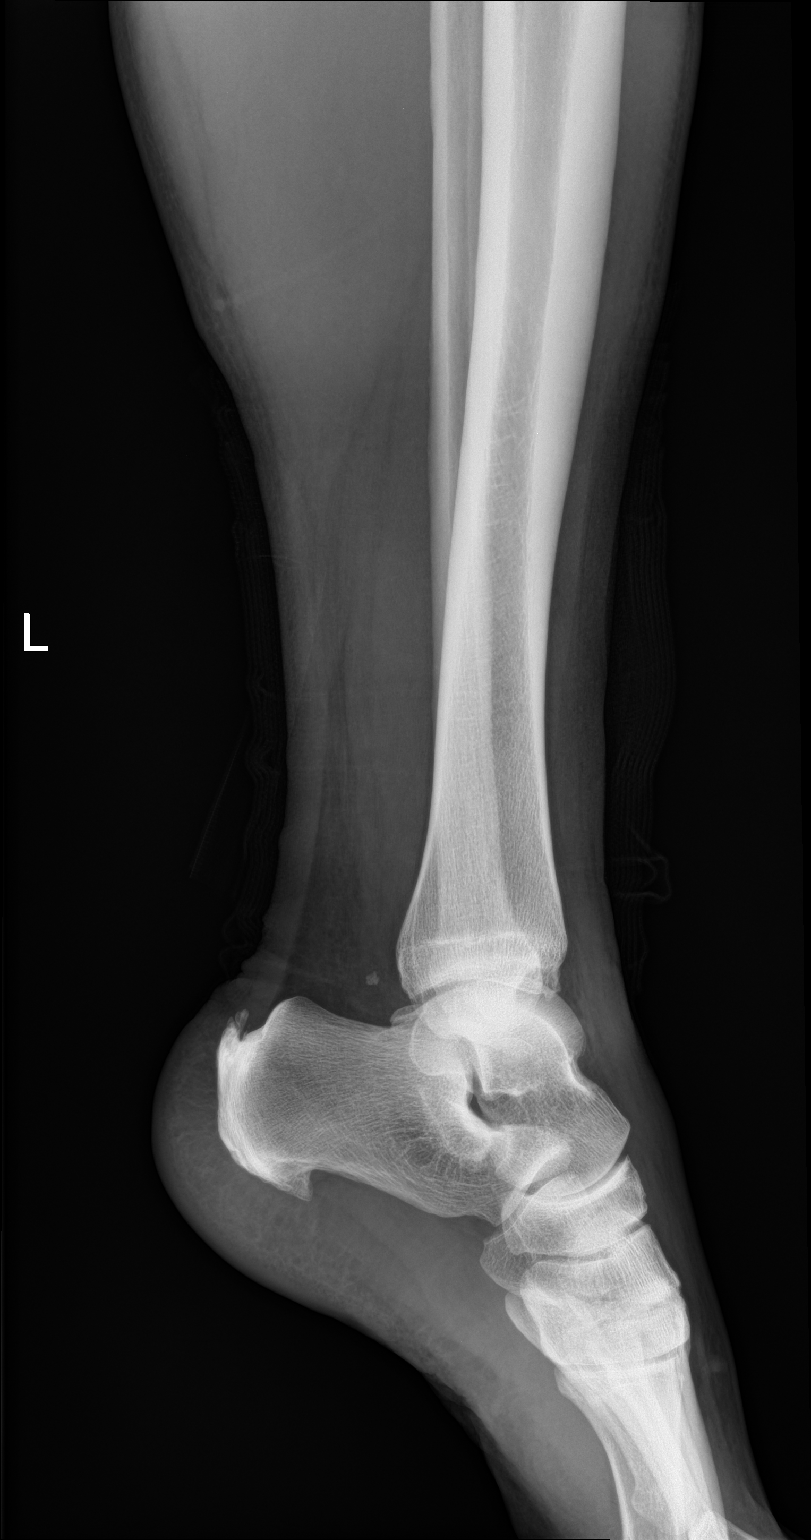

[4 of 4 positions shown; findings below may reference images not displayed]

FINDINGS: Frontal and lateral views were obtained. There is no demonstrable
fracture or dislocation. A small calcification is noted posterior to
the distal tibia, a finding of uncertain etiology but felt to be of
benign etiology. There are spurs arising from the posterior inferior
calcaneus. Joint spaces appear intact. No radiopaque foreign body.
IMPRESSION: No fracture or dislocation.  No radiopaque foreign body.

## 2016-09-12 MED FILL — AMLODIPINE BESYLATE 10 MG T: 10 | 90 days supply | Qty: 90 | Fill #1

## 2016-10-13 ENCOUNTER — Encounter: Payer: PRIVATE HEALTH INSURANCE | Admitting: Internal Medicine

## 2017-01-11 ENCOUNTER — Ambulatory Visit (HOSPITAL_BASED_OUTPATIENT_CLINIC_OR_DEPARTMENT_OTHER)
Admission: RE | Admit: 2017-01-11 | Discharge: 2017-01-11 | Disposition: A | Discharge status: Home / Self Care | Payer: BLUE CROSS/BLUE SHIELD | Source: Ambulatory Visit | Attending: Internal Medicine | Admitting: Internal Medicine

## 2017-01-11 ENCOUNTER — Telehealth: Payer: Self-pay | Admitting: Internal Medicine

## 2017-01-11 ENCOUNTER — Encounter: Payer: Self-pay | Admitting: Internal Medicine

## 2017-01-11 ENCOUNTER — Ambulatory Visit (INDEPENDENT_AMBULATORY_CARE_PROVIDER_SITE_OTHER): Payer: BLUE CROSS/BLUE SHIELD | Admitting: Internal Medicine

## 2017-01-11 VITALS — BP 116/68 | HR 85 | Temp 98.2°F | Resp 14 | Ht 71.0 in | Wt 237.0 lb

## 2017-01-11 DIAGNOSIS — Z0000 Encounter for general adult medical examination without abnormal findings: Secondary | ICD-10-CM

## 2017-01-11 DIAGNOSIS — M25531 Pain in right wrist: Secondary | ICD-10-CM | POA: Diagnosis present

## 2017-01-11 DIAGNOSIS — Z23 Encounter for immunization: Secondary | ICD-10-CM | POA: Diagnosis not present

## 2017-01-11 DIAGNOSIS — Z114 Encounter for screening for human immunodeficiency virus [HIV]: Secondary | ICD-10-CM

## 2017-01-11 DIAGNOSIS — E119 Type 2 diabetes mellitus without complications: Secondary | ICD-10-CM

## 2017-01-11 MED ORDER — AMLODIPINE BESYLATE 10 MG PO TABS: 10.0000 mg | ORAL_TABLET | Freq: Every day | ORAL | 1 refills | Status: DC

## 2017-01-11 MED FILL — AMLODIPINE BESYLATE 10 MG T: 10 | 90 days supply | Qty: 90 | Fill #0

## 2017-01-11 NOTE — Progress Notes (Signed)
Subjective:    Patient ID: Gary Zimmerman, male    DOB: 1961-02-13, 56 y.o.   MRN: [id number]  DOS:  01/11/2017 Type of visit - description : CPX Interval history: Has few concerns, we also talk about chronic medical issues.   Review of Systems Head cold for the last 2 days, mild sore throat and sinus congestion. No fever chills.No cough. Several months history of pain located between the right wrist and right thumb. Increase with use, area looks puffy but is not red or hot. Also several months history of pain in the left lower quadrant of the abdomen, "like a pulled muscle because sometimes is tender to palpation", over the last 4 weeks is somewhat radiated to the right side. Bulging at the left lower quadrant?. Denies nausea, vomiting. BMs normal without any blood or change in habits. No nausea or vomiting. No fever chills. No weight loss. He had multiple surgeries and that area as a child for hernias.   Other than above, a 14 point review of systems is negative     Past Medical History:  Diagnosis Date  . Allergic rhinitis   . Allergy    seasonal  . Asthma    no problems in years   . DJD (degenerative joint disease) 10/31/2014  . HTN (hypertension)   . Mild hyperlipidemia   . Prediabetes 12/25/2014  . Retinal detachment ~ 10-2012   sees eye MD    Past Surgical History:  Procedure Laterality Date  . HERNIA REPAIR     multiple as a child  . REFRACTIVE SURGERY     lasik  . RETINAL LASER PROCEDURE  2002    R , x 2     Social History   Social History  . Marital status: Married    Spouse name: Aurther Loft  . Number of children: 3  . Years of education: N/A   Occupational History  . 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 Smoking status: Former Games developer  . Smokeless tobacco: Former Neurosurgeon    Types: Chew     Comment: quit ~ 2016  . Alcohol use 0.0 oz/week     Comment: socially   . Drug use: No  . Sexual activity: Not on file   Other Topics  Concern  . Not on file   Social History Narrative   Household- pt and wife   Did some college    2 sons, 1 daughter     [place] Ham from [place] to [place] 11-2011       Family History  Problem Relation Age of Onset  . Diabetes Sister   . CAD Other     ?F, uncle  . Stroke Other     cousin  . Breast cancer Other     MGM  . Colon cancer Neg Hx   . Prostate cancer Neg Hx   . Rectal cancer Neg Hx   . Stomach cancer Neg Hx   . Esophageal cancer Neg Hx      Allergies as of 01/11/2017   No Known Allergies     Medication List       Accurate as of 01/11/17 11:59 PM. Always use your most recent med list.          amLODipine 10 MG tablet Commonly known as:  NORVASC Take 1 tablet (10 mg total) by mouth daily.   azelastine 0.1 % nasal spray Commonly known as:  ASTELIN Place 2 sprays  into both nostrils at bedtime. Use in each nostril as directed   cetirizine 10 MG tablet Commonly known as:  ZYRTEC Take 10 mg by mouth daily as needed.   fish oil-omega-3 fatty acids 1000 MG capsule Take 1 g by mouth daily.   fluticasone 50 MCG/ACT nasal spray Commonly known as:  FLONASE Place 2 sprays into both nostrils daily.   multivitamin tablet Take 1 tablet by mouth daily.          Objective:   Physical Exam BP 116/68 (BP Location: Left Arm, Patient Position: Sitting, Cuff Size: Normal)   Pulse 85   Temp 98.2 F (36.8 C) (Oral)   Resp 14   Ht  (1.803 m)   Wt 237 lb (107.5 kg)   SpO2 98%   BMI 33.05 kg/m   General:   Well developed, well nourished . NAD.  Neck: No  thyromegaly  HEENT:  Normocephalic . Face symmetric, atraumatic Lungs:  CTA B Normal respiratory effort, no intercostal retractions, no accessory muscle use. Heart: RRR,  no murmur.  No pretibial edema bilaterally  Abdomen:  Not distended, soft, multiple well-healed surgical scar, no TTP. No clear cut abdominal wall hernia. Good bowel sounds. Groin is without mass. MSK: Right wrist slightly puffy, range  of motion satisfactory, no TTP. Base of the thumb also slightly puffy. No redness or warmness. Rectal:  External abnormalities: none. Normal sphincter tone. No rectal masses or tenderness.  No stools found  Prostate: Prostate gland firm and smooth, no enlargement, nodularity, tenderness, mass, asymmetry or induration.  Skin: Exposed areas without rash. Not pale. Not jaundice Neurologic:  alert & oriented X3.  Speech normal, gait appropriate for age and unassisted Strength symmetric and appropriate for age.  Psych: Cognition and judgment appear intact.  Cooperative with normal attention span and concentration.  Behavior appropriate. No anxious or depressed appearing.    Assessment & Plan:   Assessment DM: A1c increased to 6.8 12-2015 HTN Hyperlipidemia Tobacco-- took chantix, then wellbutrin DJD Allergies  H/o  asthma H/o retinal detachment 2014   PLAN: Not seen since 05-2016, he didn't have insurance for a while. DM: on no meds, he is trying to stay active. Check labs HTN: On amlodipine, controlled today, reports good ambulatory BPs Hyperlipidemia: Ran out of Pravachol many months ago. Tobacco: Free of tobacco for almost 2 years, currently without cravings. We agreed that if he has cravings we can put him back on Wellbutrin which helped before Wrist pain: Check x-ray and a uric acid. Joint  definitely puffy, refer to hand surgery. Abdominal pain: Sounds like an abdominal wall issue, had multiple surgeries as a child, with mesh. Up-to-date on colon cancer screening. We talk about surgery referral for possibly further eval with a CT, he is quite reluctant, eventually we agreed to reassess on RTC;  discussed sx of SBO. RTC 4-5 months

## 2017-01-11 NOTE — Patient Instructions (Addendum)
   GO TO THE FRONT DESK Schedule your next appointment for a  Check up in 4-5 months   Schedule labs to be done this week, fasting   STOP BY THE FIRST FLOOR:  get the XR   Will refer you to a hand surgeon

## 2017-01-11 NOTE — Assessment & Plan Note (Addendum)
--  Td 2015, PNM 23: 01-2017. --CCS:  cscope 12-2014-- tics, 10 years  --Prostate cancer screening: DRE (-)., check a PSA  --Diet and exercise discussed --Labs  :  CMP, FLP, CBC, A1c, micro, PSA, uric acid, HIV

## 2017-01-11 NOTE — Progress Notes (Signed)
Pre visit review using our clinic review tool, if applicable. No additional management support is needed unless otherwise documented below in the visit note. 

## 2017-01-11 NOTE — Telephone Encounter (Signed)
Pt request Dr Drue Novel call in his BP med to the outpatient pharmacy. He says he can pick it up tomorrow.

## 2017-01-11 NOTE — Telephone Encounter (Signed)
Amlodipine refilled to [facility].

## 2017-01-13 ENCOUNTER — Other Ambulatory Visit (INDEPENDENT_AMBULATORY_CARE_PROVIDER_SITE_OTHER): Payer: BLUE CROSS/BLUE SHIELD

## 2017-01-13 DIAGNOSIS — E785 Hyperlipidemia, unspecified: Secondary | ICD-10-CM

## 2017-01-13 DIAGNOSIS — R7989 Other specified abnormal findings of blood chemistry: Secondary | ICD-10-CM | POA: Diagnosis not present

## 2017-01-13 DIAGNOSIS — E119 Type 2 diabetes mellitus without complications: Secondary | ICD-10-CM

## 2017-01-13 DIAGNOSIS — M25531 Pain in right wrist: Secondary | ICD-10-CM | POA: Diagnosis not present

## 2017-01-13 DIAGNOSIS — Z0000 Encounter for general adult medical examination without abnormal findings: Secondary | ICD-10-CM

## 2017-01-13 DIAGNOSIS — Z114 Encounter for screening for human immunodeficiency virus [HIV]: Secondary | ICD-10-CM

## 2017-01-13 LAB — COMPREHENSIVE METABOLIC PANEL
ALK PHOS: 58 U/L (ref 39–117)
ALT: 43 U/L (ref 0–53)
AST: 28 U/L (ref 0–37)
Albumin: 4 g/dL (ref 3.5–5.2)
BUN: 14 mg/dL (ref 6–23)
CHLORIDE: 100 meq/L (ref 96–112)
CO2: 30 mEq/L (ref 19–32)
Calcium: 8.8 mg/dL (ref 8.4–10.5)
Creatinine, Ser: 1.06 mg/dL (ref 0.40–1.50)
GFR: 77.17 mL/min (ref >60.00)
GLUCOSE: 122 mg/dL — AB (ref 70–99)
POTASSIUM: 4 meq/L (ref 3.5–5.1)
SODIUM: 136 meq/L (ref 135–145)
TOTAL PROTEIN: 6.6 g/dL (ref 6.0–8.3)
Total Bilirubin: 0.8 mg/dL (ref 0.2–1.2)

## 2017-01-13 LAB — MICROALBUMIN / CREATININE URINE RATIO
CREATININE, U: 119.7 mg/dL
MICROALB UR: 1.2 mg/dL (ref 0.0–1.9)
Microalb Creat Ratio: 1 mg/g (ref 0.0–30.0)

## 2017-01-13 LAB — CBC WITH DIFFERENTIAL/PLATELET
BASOS PCT: 1.2 % (ref 0.0–3.0)
Basophils Absolute: 0.1 10*3/uL (ref 0.0–0.1)
EOS ABS: 0.3 10*3/uL (ref 0.0–0.7)
Eosinophils Relative: 4.6 % (ref 0.0–5.0)
HCT: 43.9 % (ref 39.0–52.0)
Hemoglobin: 14.8 g/dL (ref 13.0–17.0)
LYMPHS PCT: 26.4 % (ref 12.0–46.0)
Lymphs Abs: 1.6 10*3/uL (ref 0.7–4.0)
MCHC: 33.6 g/dL (ref 30.0–36.0)
MCV: 89.6 fl (ref 78.0–100.0)
MONO ABS: 0.7 10*3/uL (ref 0.1–1.0)
Monocytes Relative: 10.9 % (ref 3.0–12.0)
NEUTROS ABS: 3.5 10*3/uL (ref 1.4–7.7)
NEUTROS PCT: 56.9 % (ref 43.0–77.0)
PLATELETS: 163 10*3/uL (ref 150.0–400.0)
RBC: 4.9 Mil/uL (ref 4.22–5.81)
RDW: 12.6 % (ref 11.5–15.5)
WBC: 6.2 10*3/uL (ref 4.0–10.5)

## 2017-01-13 LAB — HEMOGLOBIN A1C: HEMOGLOBIN A1C: 6.8 % — AB (ref 4.6–6.5)

## 2017-01-13 LAB — LDL CHOLESTEROL, DIRECT: Direct LDL: 111 mg/dL

## 2017-01-13 LAB — PSA: PSA: 0.23 ng/mL (ref 0.10–4.00)

## 2017-01-13 LAB — URIC ACID: URIC ACID, SERUM: 7.1 mg/dL (ref 4.0–7.8)

## 2017-01-13 LAB — LIPID PANEL
CHOL/HDL RATIO: 5
Cholesterol: 185 mg/dL (ref 0–200)
HDL: 34.4 mg/dL — AB (ref >39.00)
NonHDL: 151.06
Triglycerides: 259 mg/dL — ABNORMAL HIGH (ref 0.0–149.0)
VLDL: 51.8 mg/dL — AB (ref 0.0–40.0)

## 2017-01-13 NOTE — Assessment & Plan Note (Signed)
Not seen since 05-2016, he didn't have insurance for a while. DM: on no meds, he is trying to stay active. Check labs HTN: On amlodipine, controlled today, reports good ambulatory BPs Hyperlipidemia: Ran out of Pravachol many months ago. Tobacco: Free of tobacco for almost 2 years, currently without cravings. We agreed that if he has cravings we can put him back on Wellbutrin which helped before Wrist pain: Check x-ray and a uric acid. Joint  definitely puffy, refer to hand surgery. Abdominal pain: Sounds like an abdominal wall issue, had multiple surgeries as a child, with mesh. Up-to-date on colon cancer screening. We talk about surgery referral for possibly further eval with a CT, he is quite reluctant, eventually we agreed to reassess on RTC;  discussed sx of SBO. RTC 4-5 months

## 2017-01-14 LAB — HIV ANTIBODY (ROUTINE TESTING W REFLEX): HIV: NONREACTIVE

## 2017-01-16 MED ORDER — PRAVASTATIN SODIUM 20 MG PO TABS: 20.0000 mg | ORAL_TABLET | Freq: Every day | ORAL | 6 refills | Status: DC

## 2017-01-16 MED FILL — PRAVASTATIN NA 20 MG TAB: 20 | 30 days supply | Qty: 30 | Fill #0

## 2017-02-07 ENCOUNTER — Ambulatory Visit (INDEPENDENT_AMBULATORY_CARE_PROVIDER_SITE_OTHER): Payer: Self-pay | Admitting: Orthopaedic Surgery

## 2017-02-07 ENCOUNTER — Encounter (INDEPENDENT_AMBULATORY_CARE_PROVIDER_SITE_OTHER): Payer: Self-pay | Admitting: Orthopaedic Surgery

## 2017-02-07 ENCOUNTER — Ambulatory Visit (INDEPENDENT_AMBULATORY_CARE_PROVIDER_SITE_OTHER): Payer: BLUE CROSS/BLUE SHIELD | Admitting: Orthopaedic Surgery

## 2017-02-07 VITALS — BP 152/92 | HR 69 | Resp 14 | Ht 72.0 in | Wt 220.0 lb

## 2017-02-07 DIAGNOSIS — M19031 Primary osteoarthritis, right wrist: Secondary | ICD-10-CM

## 2017-02-07 MED ORDER — DICLOFENAC SODIUM 1 % TD GEL: 2.0000 g | Freq: Four times a day (QID) | TRANSDERMAL | 1 refills | Status: DC

## 2017-02-07 NOTE — Progress Notes (Signed)
Office Visit Note   Patient: Gary Zimmerman           Date of Birth: 1961-04-24           MRN: [id number] Visit Date: 02/07/2017              Requested by: Wanda Plump, MD [address] [place], [place] [address] PCP: Wanda Plump, MD   Assessment & Plan: Visit Diagnoses: No diagnosis found.  Plan:  At this time he is not interested in a cortisone injection because his pain is not that significant.  He is going to try the Voltaren gel 3-4 times a day as an anti-inflammatory. Hopefully protecting his kidneys  He'll use his copper glove at nighttime  If his symptoms do not improve he may return at any point in time and we can inject her wrist.  Follow-Up Instructions: Return if symptoms worsen or fail to improve.   Orders:  No orders of the defined types were placed in this encounter.  Meds ordered this encounter  Medications  . diclofenac sodium (VOLTAREN) 1 % GEL    Sig: Apply 2-4 g topically 4 (four) times daily.    Dispense:  5 Tube    Refill:  1    Order Specific Question:   Supervising Provider    Answer:   Valeria Batman [[id number]]      Procedures: No procedures performed   Clinical Data: No additional findings.   Subjective: Chief Complaint  Patient presents with  . Right Wrist - Pain, Edema    Mr. Guidotti is a 56 y o that presents with Right wrist pain x 6 months. Denies injury, swelling and pain daily with no relief (sent by Dr. Drue Novel).     Mr. Huhn is a very pleasant 56 year old white male with a 6 month history of right wrist pain. 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 There is a lot of flipping of paper to get it into the printer. He has seen Dr. Drue Novel who was concerned this may be gout  and uric acid proved to be normal. He denies a history of significant excruciating type pain but more of this constant aching-type pain. He has tried a copper brace which has been helpful for him. Carpal tunnel brace and causes him more problems and does cause numbness in his hand.  He is unable take anti-inflammatories at this time because he is prediabetic also they want to keep his kidneys from any effect of an NSAID.    Review of Systems  Endocrine:       Apparently has elevated glucoses.  All other systems reviewed and are negative.    Objective: Vital Signs: BP (!) 152/92   Pulse 69   Resp 14   Ht 6' (1.829 m)   Wt 220 lb (99.8 kg)   BMI 29.84 kg/m   Physical Exam  Constitutional: He is oriented to person, place, and time. He appears well-developed and well-nourished.  HENT:  Head: Normocephalic and atraumatic.  Eyes: EOM are normal. Pupils are equal, round, and reactive to light.  Pulmonary/Chest: Effort normal.  Neurological: He is alert and oriented to person, place, and  time.  Skin: Skin is warm and dry.  Psychiatric: He has a normal mood and affect. His behavior is normal. Judgment and thought content normal.    Right Hand Exam   Tenderness  The patient is experiencing tenderness in the dorsal area and palmer area.  Range of Motion   Wrist  Extension: 30  Flexion: 60  Pronation: normal  Supination: normal   Comments:  He does have a small effusion over the radial wrist. Some tenderness over the scapholunate and first CMC joint. No warmth or erythema.      Specialty Comments:  No specialty comments available.  Imaging: X-rays reviewed from 01/11/2017 on the PACS system apparently was read as negative. However on scrutiny there appears to be increased at the scapholunate joint which appears to be positive Roosvelt Harpserry Thomas sign. There is also some cystic changes in the lunate noted. Degenerative joint disease at the first [facility] and the scaphoid  trapezium joint.   PMFS History: Patient Active Problem List   Diagnosis Date Noted  . PCP NOTES >>>>>>> 06/25/2015  . Depression 05/07/2015  . Diabetes (HCC) 12/25/2014  . DJD (degenerative joint disease) 10/31/2014  . Annual physical exam 01/02/2013  . Tobacco chew use 09/03/2012  . HTN (hypertension)   . Mild hyperlipidemia    Past Medical History:  Diagnosis Date  . Allergic rhinitis   . Allergy    seasonal  . Asthma    no problems in years   . DJD (degenerative joint disease) 10/31/2014  . HTN (hypertension)   . Mild hyperlipidemia   . Prediabetes 12/25/2014  . Retinal detachment ~ 10-2012   sees eye MD    Family History  Problem Relation Age of Onset  . Diabetes Sister   . CAD Other     ?F, uncle  . Stroke Other     cousin  . Breast cancer Other     MGM  . Colon cancer Neg Hx   . Prostate cancer Neg Hx   . Rectal cancer Neg Hx   . Stomach cancer Neg Hx   . Esophageal cancer Neg Hx     Past Surgical History:  Procedure Laterality Date  . HERNIA REPAIR     multiple as a child  . REFRACTIVE SURGERY     lasik  . RETINAL LASER PROCEDURE  2002    R , x 2    Social History   Occupational History  . 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 Smoking status: Former Games developermoker  . Smokeless tobacco: Former NeurosurgeonUser     Comment: quit ~ 2016  . Alcohol use 0.0 oz/week     Comment: socially   . Drug use: No  . Sexual activity: Not on file

## 2017-02-21 MED FILL — PRAVASTATIN NA 20 MG TAB: 20 | 30 days supply | Qty: 30 | Fill #1

## 2017-02-28 ENCOUNTER — Other Ambulatory Visit (INDEPENDENT_AMBULATORY_CARE_PROVIDER_SITE_OTHER): Payer: BLUE CROSS/BLUE SHIELD

## 2017-02-28 DIAGNOSIS — E785 Hyperlipidemia, unspecified: Secondary | ICD-10-CM | POA: Diagnosis not present

## 2017-02-28 LAB — AST: AST: 28 U/L (ref 0–37)

## 2017-02-28 LAB — ALT: ALT: 37 U/L (ref 0–53)

## 2017-02-28 LAB — LIPID PANEL
CHOLESTEROL: 182 mg/dL (ref 0–200)
HDL: 36.1 mg/dL — ABNORMAL LOW (ref >39.00)
NonHDL: 146.38
Total CHOL/HDL Ratio: 5
Triglycerides: 221 mg/dL — ABNORMAL HIGH (ref 0.0–149.0)
VLDL: 44.2 mg/dL — AB (ref 0.0–40.0)

## 2017-02-28 LAB — LDL CHOLESTEROL, DIRECT: Direct LDL: 117 mg/dL

## 2017-03-02 MED ORDER — ATORVASTATIN CALCIUM 20 MG PO TABS: 20.0000 mg | ORAL_TABLET | Freq: Every day | ORAL | 3 refills | Status: DC

## 2017-03-02 MED FILL — ATORVASTATIN 20 MG TABLET: 20 | 30 days supply | Qty: 30 | Fill #0

## 2017-03-02 NOTE — Addendum Note (Signed)
Addended byConrad Pisinemo: Carling Liberman D on: 03/02/2017 02:14 PM   Modules accepted: Orders

## 2017-03-06 MED FILL — DICLOFENAC SODIUM 1% GEL: 1 | 30 days supply | Qty: 500 | Fill #0

## 2017-04-04 MED FILL — AMLODIPINE BESYLATE 10 MG T: 10 | 90 days supply | Qty: 90 | Fill #1

## 2017-04-04 MED FILL — ATORVASTATIN 20 MG TABLET: 20 | 30 days supply | Qty: 30 | Fill #1

## 2017-05-03 MED FILL — ATORVASTATIN 20 MG TABLET: 20 | 30 days supply | Qty: 30 | Fill #2

## 2017-05-31 ENCOUNTER — Other Ambulatory Visit (INDEPENDENT_AMBULATORY_CARE_PROVIDER_SITE_OTHER): Payer: BLUE CROSS/BLUE SHIELD

## 2017-05-31 DIAGNOSIS — E785 Hyperlipidemia, unspecified: Secondary | ICD-10-CM

## 2017-05-31 LAB — LIPID PANEL
CHOLESTEROL: 126 mg/dL (ref 0–200)
HDL: 39.2 mg/dL (ref >39.00)
LDL Cholesterol: 58 mg/dL (ref 0–99)
NONHDL: 87.1
Total CHOL/HDL Ratio: 3
Triglycerides: 144 mg/dL (ref 0.0–149.0)
VLDL: 28.8 mg/dL (ref 0.0–40.0)

## 2017-05-31 LAB — AST: AST: 22 U/L (ref 0–37)

## 2017-05-31 LAB — ALT: ALT: 30 U/L (ref 0–53)

## 2017-06-02 ENCOUNTER — Ambulatory Visit: Payer: BLUE CROSS/BLUE SHIELD | Admitting: Internal Medicine

## 2017-06-08 ENCOUNTER — Encounter: Payer: Self-pay | Admitting: Internal Medicine

## 2017-06-08 ENCOUNTER — Ambulatory Visit (INDEPENDENT_AMBULATORY_CARE_PROVIDER_SITE_OTHER): Payer: BLUE CROSS/BLUE SHIELD | Admitting: Internal Medicine

## 2017-06-08 VITALS — BP 126/68 | HR 68 | Temp 97.8°F | Resp 14 | Ht 72.0 in | Wt 235.5 lb

## 2017-06-08 DIAGNOSIS — Z720 Tobacco use: Secondary | ICD-10-CM

## 2017-06-08 DIAGNOSIS — E785 Hyperlipidemia, unspecified: Secondary | ICD-10-CM | POA: Diagnosis not present

## 2017-06-08 DIAGNOSIS — I10 Essential (primary) hypertension: Secondary | ICD-10-CM

## 2017-06-08 DIAGNOSIS — E119 Type 2 diabetes mellitus without complications: Secondary | ICD-10-CM | POA: Diagnosis not present

## 2017-06-08 MED ORDER — BUPROPION HCL ER (SR) 100 MG PO TB12: 100.0000 mg | ORAL_TABLET | Freq: Two times a day (BID) | ORAL | 5 refills | Status: DC

## 2017-06-08 MED FILL — BUPROPION HCL SR 100 MG TAB: 100 | 30 days supply | Qty: 60 | Fill #0

## 2017-06-08 NOTE — Progress Notes (Signed)
Pre visit review using our clinic review tool, if applicable. No additional management support is needed unless otherwise documented below in the visit note. 

## 2017-06-08 NOTE — Patient Instructions (Signed)
Next visit in 4-5 months  Continue the same medications and add Wellbutrin.  Wellbutrin 100 mg: 1 tablet daily for 10 days, then one tablet twice a day    Check the American diabetes Association website for information about diet  Check the American Heart Association website for information about quitting tobacco

## 2017-06-08 NOTE — Progress Notes (Signed)
Subjective:    Patient ID: Gary Zimmerman, male    DOB: Sep 29, 1961, 56 y.o.   MRN: [id number]  DOS:  06/08/2017 Type of visit - description : rov Interval history: DM: Last A1c 6.8, no medication, room for improvement on his lifestyle High cholesterol: good med compliance no apparent side effects Tobacco abuse: He relapsed 2 weeks ago after a stressful week at work. Wellbutrin?.   Review of Systems Feels  slightly rundown lately, denies chest pain, difficulty breathing, lower extremity or palpitations. He does snore sometimes but does not feel sleepy just tired. Thinks could be related to stress. He has not been able to exercise much lately either. No depression  Past Medical History:  Diagnosis Date  . Allergic rhinitis   . Allergy    seasonal  . Asthma    no problems in years   . DJD (degenerative joint disease) 10/31/2014  . HTN (hypertension)   . Mild hyperlipidemia   . Prediabetes 12/25/2014  . Retinal detachment ~ 10-2012   sees eye MD    Past Surgical History:  Procedure Laterality Date  . HERNIA REPAIR     multiple as a child  . REFRACTIVE SURGERY     lasik  . RETINAL LASER PROCEDURE  2002    R , x 2     Social History   Social History  . Marital status: Married    Spouse name: Gary Zimmerman  . Number of children: 3  . Years of education: N/A   Occupational History  . 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 Smoking status: Former Games developer  . Smokeless tobacco: Former Neurosurgeon     Comment: quit ~ 2016  . Alcohol use 0.0 oz/week     Comment: socially   . Drug use: No  . Sexual activity: Not on file   Other Topics Concern  . Not on file   Social History Narrative   Household- pt and wife   Did some college    2 sons, 1 daughter     Linton Ham from [place] to [place] 11-2011        Allergies as of 06/08/2017   No Known Allergies     Medication List       Accurate as of 06/08/17 11:59 PM. Always use your most recent med list.            amLODipine 10 MG tablet Commonly known as:  NORVASC Take 1 tablet (10 mg total) by mouth daily.   atorvastatin 20 MG tablet Commonly known as:  LIPITOR Take 1 tablet (20 mg total) by mouth at bedtime.   azelastine 0.1 % nasal spray Commonly known as:  ASTELIN Place 2 sprays into both nostrils at bedtime. Use in each nostril as directed   buPROPion 100 MG 12 hr tablet Commonly known as:  WELLBUTRIN SR Take 1 tablet (100 mg total) by mouth 2 (two) times daily.   cetirizine 10 MG tablet Commonly known as:  ZYRTEC Take 10 mg by mouth daily as needed.   diclofenac sodium 1 % Gel Commonly known as:  VOLTAREN Apply 2-4 g topically 4 (four) times daily.   fish oil-omega-3 fatty acids 1000 MG capsule Take 1 g by mouth daily.   fluticasone 50 MCG/ACT nasal spray Commonly known as:  FLONASE Place 2 sprays into both nostrils daily.   multivitamin tablet Take 1 tablet by mouth daily.  Discharge Care Instructions        Start     Ordered   06/08/17 0000  buPROPion (WELLBUTRIN SR) 100 MG 12 hr tablet  2 times daily     06/08/17 1521         Objective:   Physical Exam BP 126/68 (BP Location: Left Arm, Patient Position: Sitting, Cuff Size: Normal)   Pulse 68   Temp 97.8 F (36.6 C) (Oral)   Resp 14   Ht 6' (1.829 m)   Wt 235 lb 8 oz (106.8 kg)   SpO2 98%   BMI 31.94 kg/m  General:   Well developed, well nourished . NAD.  HEENT:  Normocephalic . Face symmetric, atraumatic Lungs:  CTA B Normal respiratory effort, no intercostal retractions, no accessory muscle use. Heart: RRR,  no murmur.  No pretibial edema bilaterally  Skin: Not pale. Not jaundice Neurologic:  alert & oriented X3.  Speech normal, gait appropriate for age and unassisted Psych--  Cognition and judgment appear intact.  Cooperative with normal attention span and concentration.  Behavior appropriate. No anxious or depressed appearing.      Assessment & Plan:   Assessment DM:  A1c increased to 6.8 12-2015 HTN Hyperlipidemia (Previously was somewhat intolerant to statins, as off 06-2017 on Lipitor and doing well) Tobacco-- took chantix, then wellbutrin DJD Allergies  H/o  asthma H/o retinal detachment 2014  PLAN: All recent labs reviewed and discussed with the patient DM: Last A1c 6.8, diet control, needs to improve lifestyle, encouragement and information provided. HTN: On amlodipine, BP looks great today. High cholesterol: Since the last visit the started Lipitor, very good results, compliance and tolerance Tobacco abuse: Relapsed due to stress, chewing tobacco again x  2 weeks; we agreed to restart Wellbutrin. RTC 4-5 months.

## 2017-06-10 NOTE — Assessment & Plan Note (Signed)
All recent labs reviewed and discussed with the patient DM: Last A1c 6.8, diet control, needs to improve lifestyle, encouragement and information provided. HTN: On amlodipine, BP looks great today. High cholesterol: Since the last visit the started Lipitor, very good results, compliance and tolerance Tobacco abuse: Relapsed due to stress, chewing tobacco again x  2 weeks; we agreed to restart Wellbutrin. RTC 4-5 months.

## 2017-06-13 MED FILL — ATORVASTATIN 20 MG TABLET: 20 | 30 days supply | Qty: 30 | Fill #3

## 2017-07-17 ENCOUNTER — Other Ambulatory Visit: Payer: Self-pay | Admitting: Internal Medicine

## 2017-07-17 MED FILL — AMLODIPINE BESYLATE 10 MG T: 10 | 90 days supply | Qty: 90 | Fill #0

## 2017-07-17 MED FILL — BUPROPION HCL SR 100 MG TAB: 100 | 30 days supply | Qty: 60 | Fill #1

## 2017-07-19 ENCOUNTER — Other Ambulatory Visit: Payer: Self-pay | Admitting: Internal Medicine

## 2017-07-28 MED FILL — ATORVASTATIN 20 MG TABLET: 20 | 30 days supply | Qty: 30 | Fill #0

## 2017-09-01 MED FILL — ATORVASTATIN 20 MG TABLET: 20 | 30 days supply | Qty: 30 | Fill #1

## 2017-09-28 MED FILL — BUPROPION HCL SR 100 MG TAB: 100 | 30 days supply | Qty: 60 | Fill #2

## 2017-10-16 MED FILL — ATORVASTATIN 20 MG TABLET: 20 | 30 days supply | Qty: 30 | Fill #2

## 2017-10-16 MED FILL — AMLODIPINE BESYLATE 10 MG T: 10 | 90 days supply | Qty: 90 | Fill #1

## 2017-10-23 ENCOUNTER — Telehealth: Payer: Self-pay | Admitting: Internal Medicine

## 2017-10-23 NOTE — Telephone Encounter (Signed)
Called pt and left message that this appt needs to be rescheduled per the pcp.

## 2017-11-01 MED FILL — BUPROPION HCL SR 100 MG TAB: 100 | 30 days supply | Qty: 60 | Fill #3

## 2017-11-10 ENCOUNTER — Ambulatory Visit: Payer: BLUE CROSS/BLUE SHIELD | Admitting: Internal Medicine

## 2017-11-13 ENCOUNTER — Encounter: Payer: Self-pay | Admitting: Internal Medicine

## 2017-11-13 ENCOUNTER — Ambulatory Visit: Payer: BLUE CROSS/BLUE SHIELD | Admitting: Internal Medicine

## 2017-11-13 VITALS — BP 124/72 | HR 59 | Temp 97.8°F | Resp 14 | Ht 72.0 in | Wt 229.5 lb

## 2017-11-13 DIAGNOSIS — F329 Major depressive disorder, single episode, unspecified: Secondary | ICD-10-CM | POA: Diagnosis not present

## 2017-11-13 DIAGNOSIS — E785 Hyperlipidemia, unspecified: Secondary | ICD-10-CM

## 2017-11-13 DIAGNOSIS — Z23 Encounter for immunization: Secondary | ICD-10-CM | POA: Diagnosis not present

## 2017-11-13 DIAGNOSIS — I10 Essential (primary) hypertension: Secondary | ICD-10-CM

## 2017-11-13 DIAGNOSIS — E119 Type 2 diabetes mellitus without complications: Secondary | ICD-10-CM | POA: Diagnosis not present

## 2017-11-13 DIAGNOSIS — F419 Anxiety disorder, unspecified: Secondary | ICD-10-CM

## 2017-11-13 DIAGNOSIS — F32A Depression, unspecified: Secondary | ICD-10-CM

## 2017-11-13 MED FILL — ATORVASTATIN 20 MG TABLET: 20 | 30 days supply | Qty: 30 | Fill #3

## 2017-11-13 NOTE — Patient Instructions (Signed)
GO TO THE LAB : Get the blood work     GO TO THE FRONT DESK Schedule your next appointment for a nickel exam in 4-5 months

## 2017-11-13 NOTE — Progress Notes (Signed)
Pre visit review using our clinic review tool, if applicable. No additional management support is needed unless otherwise documented below in the visit note. 

## 2017-11-13 NOTE — Progress Notes (Signed)
Subjective:    Patient ID: Gary Zimmerman, male    DOB: Dec 18, 1960, 57 y.o.   MRN: [id number]  DOS:  11/13/2017 Type of visit - description : Routine visit Interval history: DM: Diet controlled, doing well with lifestyle  Tobacco abuse: On Wellbutrin, he remains tobacco free.  Anxiety: For the last several weeks he has been very nervous, worried about everything, unable to sleep well, "my mind keeps thinking". I asked him about the stressors of life, he reports is ready to switch jobs, he actually  Is going to be traveling in a RV and work  on the go. This is a dream of his wife and him,  although might be a stressful proposition he feels happy about it.  Also  has developed right heel pain, for a few months, located at the external malleolus.  No pain with weightbearing, the pain is actually worse when he is step out off the floor.  Review of Systems Has chest pain or difficulty breathing. No depression or suicidal ideas  Past Medical History:  Diagnosis Date  . Allergic rhinitis   . Allergy    seasonal  . Asthma    no problems in years   . DJD (degenerative joint disease) 10/31/2014  . HTN (hypertension)   . Mild hyperlipidemia   . Prediabetes 12/25/2014  . Retinal detachment ~ 10-2012   sees eye MD    Past Surgical History:  Procedure Laterality Date  . HERNIA REPAIR     multiple as a child  . REFRACTIVE SURGERY     lasik  . RETINAL LASER PROCEDURE  2002    R , x 2     Social History   Socioeconomic History  . Marital status: Married    Spouse name: Aurther Loft  . Number of children: 3  . Years of education: Not on file  . Highest education level: Not on file  Social Needs  . Financial resource strain: Not on file  . Food insecurity - worry: Not on file  . Food insecurity - inability: Not on file  . Transportation needs - medical: Not on file  . Transportation needs - non-medical: Not on file  Occupational History  . 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 Smoking status: Former Games developer  . Smokeless tobacco: Former Neurosurgeon  . Tobacco comment: quit ~ 2016  Substance and Sexual Activity  . Alcohol use: Yes    Alcohol/week: 0.0 oz    Comment: socially   . Drug use: No  . Sexual activity: Not on file  Other Topics Concern  . Not on file  Social History Narrative   Household- pt and wife   Did some college    2 sons, 1 daughter     Linton Ham from [place] to [place] 11-2011        Allergies as of 11/13/2017   No Known Allergies     Medication List        Accurate as of 11/13/17 11:59 PM. Always use your most recent med list.          amLODipine 10 MG tablet Commonly known as:  NORVASC Take 1 tablet (10 mg total) by mouth daily.   atorvastatin 20 MG tablet Commonly known as:  LIPITOR Take 1 tablet (20 mg total) by mouth at bedtime.   azelastine 0.1 % nasal spray Commonly known as:  ASTELIN Place 2 sprays into both nostrils at bedtime. Use in each nostril as  directed   buPROPion 100 MG 12 hr tablet Commonly known as:  WELLBUTRIN SR Take 1 tablet (100 mg total) by mouth 2 (two) times daily.   cetirizine 10 MG tablet Commonly known as:  ZYRTEC Take 10 mg by mouth daily as needed.   diclofenac sodium 1 % Gel Commonly known as:  VOLTAREN Apply 2-4 g topically 4 (four) times daily.   fish oil-omega-3 fatty acids 1000 MG capsule Take 1 g by mouth daily.   fluticasone 50 MCG/ACT nasal spray Commonly known as:  FLONASE Place 2 sprays into both nostrils daily.   multivitamin tablet Take 1 tablet by mouth daily.          Objective:   Physical Exam BP 124/72 (BP Location: Right Arm, Patient Position: Sitting, Cuff Size: Small)   Pulse (!) 59   Temp 97.8 F (36.6 C) (Oral)   Resp 14   Ht 6' (1.829 m)   Wt 229 lb 8 oz (104.1 kg)   SpO2 96%   BMI 31.13 kg/m   General:   Well developed, well nourished . NAD.  HEENT:  Normocephalic . Face symmetric, atraumatic Lungs:  CTA B Normal respiratory effort, no  intercostal retractions, no accessory muscle use. Heart: RRR,  no murmur.  No pretibial edema bilaterally MSK: Ankles and feet symmetric, normal, no TTP.  No deformities, swelling or redness DIABETIC FEET EXAM: No lower extremity edema Normal pedal pulses bilaterally Skin normal, nails normal, no calluses Pinprick examination of the feet normal. Skin: Not pale. Not jaundice Neurologic:  alert & oriented X3.  Speech normal, gait appropriate for age and unassisted Psych--  Cognition and judgment appear intact.  Cooperative with normal attention span and concentration.  Behavior appropriate. No anxious or depressed appearing.       Assessment & Plan:    Assessment DM: A1c increased to 6.8 12-2015 HTN Hyperlipidemia (Previously was somewhat intolerant to statins, as off 06-2017 on Lipitor and doing well) Tobacco-- took chantix, then wellbutrin DJD Allergies  H/o  asthma H/o retinal detachment 2014  PLAN: DM: Diet controlled, doing well with diet lifestyle, feet exam negative, reports negative eye exam 3 weeks ago, will check a A1c, micro  HTN: On amlodipine, check a BMP High cholesterol: On Lipitor, last FLP, AST, ALT satisfactory. Right ankle pain: Exam normal, we talk about possibly sports medicine referral, we agreed to wait, will continue using Tylenol as needed on ice. Tobacco abuse: free ot tobacco,  cont Wellbutrin.    Anxiety: New issue, related to the new lifestyle he is ready to embrace?.  We discussed different options including counseling, SSRIs and prn meds. He actually could take SSRI along w/ Wellbutrin.  He is not ready for medication, he will let me know. Social: he reports is ready to switch jobs, he actually  Is going to be travel in a RV and work  on the go.  Plans to come to this area regularly for medical visits. Flu shot today RTC 4-5 months fasting, CPX

## 2017-11-14 ENCOUNTER — Other Ambulatory Visit (INDEPENDENT_AMBULATORY_CARE_PROVIDER_SITE_OTHER): Payer: BLUE CROSS/BLUE SHIELD

## 2017-11-14 DIAGNOSIS — E119 Type 2 diabetes mellitus without complications: Secondary | ICD-10-CM

## 2017-11-14 LAB — BASIC METABOLIC PANEL
BUN: 12 mg/dL (ref 6–23)
CO2: 31 mEq/L (ref 19–32)
Calcium: 9.4 mg/dL (ref 8.4–10.5)
Chloride: 100 mEq/L (ref 96–112)
Creatinine, Ser: 1.12 mg/dL (ref 0.40–1.50)
GFR: 72.2 mL/min (ref >60.00)
GLUCOSE: 136 mg/dL — AB (ref 70–99)
Potassium: 4.5 mEq/L (ref 3.5–5.1)
Sodium: 138 mEq/L (ref 135–145)

## 2017-11-14 LAB — MICROALBUMIN / CREATININE URINE RATIO
CREATININE, U: 104.6 mg/dL
MICROALB UR: 1 mg/dL (ref 0.0–1.9)
Microalb Creat Ratio: 0.9 mg/g (ref 0.0–30.0)

## 2017-11-14 LAB — HEMOGLOBIN A1C: Hgb A1c MFr Bld: 6.9 % — ABNORMAL HIGH (ref 4.6–6.5)

## 2017-11-14 NOTE — Assessment & Plan Note (Signed)
DM: Diet controlled, doing well with diet lifestyle, feet exam negative, reports negative eye exam 3 weeks ago, will check a A1c, micro  HTN: On amlodipine, check a BMP High cholesterol: On Lipitor, last FLP, AST, ALT satisfactory. Right ankle pain: Exam normal, we talk about possibly sports medicine referral, we agreed to wait, will continue using Tylenol as needed on ice. Tobacco abuse: free ot tobacco,  cont Wellbutrin.    Anxiety: New issue, related to the new lifestyle he is ready to embrace?.  We discussed different options including counseling, SSRIs and prn meds. He actually could take SSRI along w/ Wellbutrin.  He is not ready for medication, he will let me know. Social: he reports is ready to switch jobs, he actually  Is going to be travel in a RV and work  on the go.  Plans to come to this area regularly for medical visits. Flu shot today

## 2017-11-30 MED FILL — BUPROPION HCL SR 100 MG TAB: 100 | 30 days supply | Qty: 60 | Fill #4

## 2017-12-18 MED FILL — ATORVASTATIN 20 MG TABLET: 20 | 30 days supply | Qty: 30 | Fill #4

## 2017-12-19 ENCOUNTER — Encounter: Payer: Self-pay | Admitting: Internal Medicine

## 2017-12-19 DIAGNOSIS — M25571 Pain in right ankle and joints of right foot: Secondary | ICD-10-CM

## 2017-12-25 ENCOUNTER — Ambulatory Visit (INDEPENDENT_AMBULATORY_CARE_PROVIDER_SITE_OTHER): Payer: BLUE CROSS/BLUE SHIELD | Admitting: Family Medicine

## 2017-12-25 ENCOUNTER — Encounter: Payer: Self-pay | Admitting: Family Medicine

## 2017-12-25 DIAGNOSIS — M25572 Pain in left ankle and joints of left foot: Secondary | ICD-10-CM | POA: Diagnosis not present

## 2017-12-25 NOTE — Assessment & Plan Note (Signed)
2/2 posterior tibialis tenosynovitis and achilles tendinitis.  Ibuprofen or aleve for 7-10 days then as needed.  Shown home exercises to do daily.  Icing.  Avoid uneven ground, hills.  Avoid flat shoes, barefoot walking.  Heel lifts with arch supports.  F/u in 6 weeks.

## 2017-12-25 NOTE — Patient Instructions (Signed)
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 Ibuprofen 600mg  three times a day with food OR aleve 2 tabs twice a day with food for pain and inflammation - typically take for 7-10 days then as needed. theraband strengthening with yellow band 3 sets of 10 once a day (can work your way up to this). Calf raises 3 sets of 10 on level ground once a day first. When these are easy, can do them one legged 3 sets of 10. Finally advance to doing them on a step. Can add heel walks, toe walks forward and backward as well Ice bucket 10-15 minutes at end of day - can ice 3-4 times a day. Avoid uneven ground, hills as much as possible. Avoid flat shoes, barefoot walking. Heel lifts plus the inserts are the most effective for both conditions. Consider physical therapy, custom orthotics, nitro patches if not improving as expected. Follow up in 6 weeks.

## 2017-12-25 NOTE — Progress Notes (Signed)
PCP and consultation requested by: Wanda PlumpPaz, Jose E, MD  Subjective:   HPI: Patient is a 57 y.o. male here for left ankle pain.  Patient denies known injury or trauma. Patient reports he's had about 3 months of medial left ankle pain. Pain worse by end of work day - stands/walks on concrete floor. Was so severe previously that he used a cam walker for a couple weeks which helped. Tried different shoes, tylenol. Just started new inserts (superfeet). Pain level 5/10 and sharp by end of day. No skin changes, numbness.  Past Medical History:  Diagnosis Date  . Allergic rhinitis   . Allergy    seasonal  . Asthma    no problems in years   . DJD (degenerative joint disease) 10/31/2014  . HTN (hypertension)   . Mild hyperlipidemia   . Prediabetes 12/25/2014  . Retinal detachment ~ 10-2012   sees eye MD    Current Outpatient Medications on File Prior to Visit  Medication Sig Dispense Refill  . amLODipine (NORVASC) 10 MG tablet Take 1 tablet (10 mg total) by mouth daily. 90 tablet 1  . atorvastatin (LIPITOR) 20 MG tablet Take 1 tablet (20 mg total) by mouth at bedtime. 30 tablet 5  . azelastine (ASTELIN) 0.1 % nasal spray Place 2 sprays into both nostrils at bedtime. Use in each nostril as directed 90 mL 1  . buPROPion (WELLBUTRIN SR) 100 MG 12 hr tablet Take 1 tablet (100 mg total) by mouth 2 (two) times daily. 60 tablet 5  . cetirizine (ZYRTEC) 10 MG tablet Take 10 mg by mouth daily as needed.     . diclofenac sodium (VOLTAREN) 1 % GEL Apply 2-4 g topically 4 (four) times daily. (Patient not taking: Reported on 06/08/2017) 5 Tube 1  . fish oil-omega-3 fatty acids 1000 MG capsule Take 1 g by mouth daily.    . fluticasone (FLONASE) 50 MCG/ACT nasal spray Place 2 sprays into both nostrils daily. 48 g 3  . Multiple Vitamin (MULTIVITAMIN) tablet Take 1 tablet by mouth daily.     No current facility-administered medications on file prior to visit.     Past Surgical History:  Procedure  Laterality Date  . HERNIA REPAIR     multiple as a child  . REFRACTIVE SURGERY     lasik  . RETINAL LASER PROCEDURE  2002    R , x 2     No Known Allergies  Social History   Socioeconomic History  . Marital status: Married    Spouse name: Aurther Lofterry  . Number of children: 3  . Years of education: Not on file  . Highest education level: Not on file  Occupational History  . 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 Financial resource strain: Not on file  . Food insecurity:    Worry: Not on file    Inability: Not on file  . Transportation needs:    Medical: Not on file    Non-medical: Not on file  Tobacco Use  . Smoking status: Former Games developermoker  . Smokeless tobacco: Former NeurosurgeonUser  . Tobacco comment: quit ~ 2016  Substance and Sexual Activity  . Alcohol use: Yes    Alcohol/week: 0.0 oz    Comment: socially   . Drug use: No  . Sexual activity: Not on file  Lifestyle  . Physical activity:    Days per week: Not on file    Minutes per session: Not on file  . Stress: Not  on file  Relationships  . Social connections:    Talks on phone: Not on file    Gets together: Not on file    Attends religious service: Not on file    Active member of club or organization: Not on file    Attends meetings of clubs or organizations: Not on file    Relationship status: Not on file  . Intimate partner violence:    Fear of current or ex partner: Not on file    Emotionally abused: Not on file    Physically abused: Not on file    Forced sexual activity: Not on file  Other Topics Concern  . Not on file  Social History Narrative   Household- pt and wife   Did some college    2 sons, 1 daughter     Linton Ham from [place] to [place] 11-2011      Family History  Problem Relation Age of Onset  . Diabetes Sister   . CAD Other        ?F, uncle  . Stroke Other        cousin  . Breast cancer Other        MGM  . Colon cancer Neg Hx   . Prostate cancer Neg Hx   . Rectal cancer Neg Hx    . Stomach cancer Neg Hx   . Esophageal cancer Neg Hx     BP (!) 141/85   Pulse 60   Ht 5\' 11"  (1.803 m)   Wt 210 lb (95.3 kg)   BMI 29.29 kg/m   Review of Systems: See HPI above.     Objective:  Physical Exam:  Gen: NAD, comfortable in exam room  Left ankle: No gross deformity, swelling, ecchymoses FROM with pain on full ER (felt medially). TTP posterior tibialis tendon and at achilles tendon insertion on calcaneus.  No other tenderness. Negative ant drawer and talar tilt.   Negative syndesmotic compression. Negative calcaneal squeeze. Thompsons test negative. NV intact distally.  Right ankle: No deformity. FROM with 5/5 strength. No tenderness to palpation. NVI distally.  MSK u/s left ankle: tenosynovitis and thickening of post tibialis tendon.  Achilles with haglund's and insertional tendinitis with neovascularity.  No other abnormalities.   Assessment & Plan:  1. Left ankle pain - 2/2 posterior tibialis tenosynovitis and achilles tendinitis.  Ibuprofen or aleve for 7-10 days then as needed.  Shown home exercises to do daily.  Icing.  Avoid uneven ground, hills.  Avoid flat shoes, barefoot walking.  Heel lifts with arch supports.  F/u in 6 weeks.

## 2018-01-10 MED FILL — ATORVASTATIN 20 MG TABLET: 20 | 30 days supply | Qty: 30 | Fill #5

## 2018-01-10 MED FILL — BUPROPION HCL SR 100 MG TAB: 100 | 30 days supply | Qty: 60 | Fill #5

## 2018-02-05 ENCOUNTER — Ambulatory Visit: Payer: BLUE CROSS/BLUE SHIELD | Admitting: Family Medicine

## 2018-02-21 ENCOUNTER — Encounter: Payer: Self-pay | Admitting: Internal Medicine

## 2018-02-21 ENCOUNTER — Ambulatory Visit: Payer: BLUE CROSS/BLUE SHIELD | Admitting: Internal Medicine

## 2018-02-21 VITALS — BP 126/68 | HR 79 | Temp 97.9°F | Resp 14 | Ht 71.0 in | Wt 234.5 lb

## 2018-02-21 DIAGNOSIS — E785 Hyperlipidemia, unspecified: Secondary | ICD-10-CM

## 2018-02-21 DIAGNOSIS — N183 Chronic kidney disease, stage 3 unspecified: Secondary | ICD-10-CM

## 2018-02-21 DIAGNOSIS — I10 Essential (primary) hypertension: Secondary | ICD-10-CM

## 2018-02-21 DIAGNOSIS — E1122 Type 2 diabetes mellitus with diabetic chronic kidney disease: Secondary | ICD-10-CM

## 2018-02-21 MED ORDER — AZELASTINE HCL 0.1 % NA SOLN: 2.0000 | Freq: Every day | NASAL | 3 refills | Status: AC

## 2018-02-21 MED ORDER — BUPROPION HCL ER (SR) 100 MG PO TB12: 100.0000 mg | ORAL_TABLET | Freq: Two times a day (BID) | ORAL | 3 refills | Status: DC

## 2018-02-21 MED ORDER — ATORVASTATIN CALCIUM 20 MG PO TABS: 20.0000 mg | ORAL_TABLET | Freq: Every day | ORAL | 3 refills | Status: DC

## 2018-02-21 MED ORDER — FLUTICASONE PROPIONATE 50 MCG/ACT NA SUSP: 2.0000 | Freq: Every day | NASAL | 3 refills | Status: DC

## 2018-02-21 MED ORDER — AMLODIPINE BESYLATE 10 MG PO TABS: 10.0000 mg | ORAL_TABLET | Freq: Every day | ORAL | 3 refills | Status: DC

## 2018-02-21 NOTE — Patient Instructions (Signed)
  GO TO THE FRONT DESK  Schedule labs to be done fasting tomorrow     Check the  blood pressure 2 or 3 times a month   Be sure your blood pressure is between 110/65 and  135/85. If it is consistently higher or lower, let me know

## 2018-02-21 NOTE — Progress Notes (Signed)
Subjective:    Patient ID: Gary Zimmerman, male    DOB: 05-09-1961, 57 y.o.   MRN: [id number]  DOS:  02/21/2018 Type of visit - description : Routine visit Interval history: No major concerns Doing better w/ diet Good medication compliance Stress has definitely decreased since previous visit   Review of Systems  Denies chest pain or difficulty breathing No nausea, vomiting, diarrhea Past Medical History:  Diagnosis Date  . Allergic rhinitis   . Allergy    seasonal  . Asthma    no problems in years   . DJD (degenerative joint disease) 10/31/2014  . HTN (hypertension)   . Mild hyperlipidemia   . Prediabetes 12/25/2014  . Retinal detachment ~ 10-2012   sees eye MD    Past Surgical History:  Procedure Laterality Date  . HERNIA REPAIR     multiple as a child  . REFRACTIVE SURGERY     lasik  . RETINAL LASER PROCEDURE  2002    R , x 2     Social History   Socioeconomic History  . Marital status: Married    Spouse name: Aurther Loft  . Number of children: 3  . Years of education: Not on file  . Highest education level: Not on file  Occupational History  . Occupation: Solicitor , Editor, commissioning [employer]  Social Needs  . Financial resource strain: Not on file  . Food insecurity:    Worry: Not on file    Inability: Not on file  . Transportation needs:    Medical: Not on file    Non-medical: Not on file  Tobacco Use  . Smoking status: Former Games developer  . Smokeless tobacco: Former Neurosurgeon  . Tobacco comment: quit ~ 2016  Substance and Sexual Activity  . Alcohol use: Yes    Alcohol/week: 0.0 oz    Comment: socially   . Drug use: No  . Sexual activity: Not on file  Lifestyle  . Physical activity:    Days per week: Not on file    Minutes per session: Not on file  . Stress: Not on file  Relationships  . Social connections:    Talks on phone: Not on file    Gets together: Not on file    Attends religious service: Not on file    Active member of club or organization: Not on  file    Attends meetings of clubs or organizations: Not on file    Relationship status: Not on file  . Intimate partner violence:    Fear of current or ex partner: Not on file    Emotionally abused: Not on file    Physically abused: Not on file    Forced sexual activity: Not on file  Other Topics Concern  . Not on file  Social History Narrative   Household- pt and wife   Did some college    2 sons, 1 daughter     Linton Ham from [place] to [place] 11-2011        Allergies as of 02/21/2018   No Known Allergies     Medication List        Accurate as of 02/21/18 11:59 PM. Always use your most recent med list.          amLODipine 10 MG tablet Commonly known as:  NORVASC Take 1 tablet (10 mg total) by mouth daily.   atorvastatin 20 MG tablet Commonly known as:  LIPITOR Take 1 tablet (20 mg total) by mouth at  bedtime.   azelastine 0.1 % nasal spray Commonly known as:  ASTELIN Place 2 sprays into both nostrils at bedtime. Use in each nostril as directed   buPROPion 100 MG 12 hr tablet Commonly known as:  WELLBUTRIN SR Take 1 tablet (100 mg total) by mouth 2 (two) times daily.   cetirizine 10 MG tablet Commonly known as:  ZYRTEC Take 10 mg by mouth daily as needed.   diclofenac sodium 1 % Gel Commonly known as:  VOLTAREN Apply 2-4 g topically 4 (four) times daily.   fish oil-omega-3 fatty acids 1000 MG capsule Take 1 g by mouth daily.   fluticasone 50 MCG/ACT nasal spray Commonly known as:  FLONASE Place 2 sprays into both nostrils daily.   multivitamin tablet Take 1 tablet by mouth daily.          Objective:   Physical Exam BP 126/68 (BP Location: Left Arm, Patient Position: Sitting, Cuff Size: Normal)   Pulse 79   Temp 97.9 F (36.6 C) (Oral)   Resp 14   Ht  (1.803 m)   Wt 234 lb 8 oz (106.4 kg)   SpO2 97%   BMI 32.71 kg/m  General:   Well developed, well nourished . NAD.  HEENT:  Normocephalic . Face symmetric, atraumatic Lungs:  CTA B Normal  respiratory effort, no intercostal retractions, no accessory muscle use. Heart: RRR,  no murmur.  No pretibial edema bilaterally  Skin: Not pale. Not jaundice Neurologic:  alert & oriented X3.  Speech normal, gait appropriate for age and unassisted Psych--  Cognition and judgment appear intact.  Cooperative with normal attention span and concentration.  Behavior appropriate. No anxious or depressed appearing.      Assessment & Plan:    Assessment DM: A1c increased to 6.8 12-2015 HTN Hyperlipidemia (Previously was somewhat intolerant to statins, as off 06-2017 on Lipitor and doing well) Tobacco-- took chantix, then wellbutrin DJD Allergies  H/o  asthma H/o retinal detachment 2014  PLAN: DM: Diet controlled, last A1c was 6.9, since then he is doing better with diet.  Check a A1c. HTN, on amlodipine, check a CMP and CBC High cholesterol: On Lipitor, check a FLP. Anxiety: Stress decreased since the last visit, doing well. Social: In a couple of weeks he is ready to change his lifestyle and will be traveling on his RV and working on the go.  Advised he will have to be check regularly wherever he is(given his medical problems).  He is definitely welcome to come to the office when needed. Tobacco abuse: Still on Wellbutrin, is definitely stopping the cravings, we talk about stop versus continue.  Elected to continue Wellbutrin since he is having no side effects and is preventing him from falling back on tobacco. All prescriptions were refilled

## 2018-02-21 NOTE — Progress Notes (Signed)
Pre visit review using our clinic review tool, if applicable. No additional management support is needed unless otherwise documented below in the visit note. 

## 2018-02-22 ENCOUNTER — Other Ambulatory Visit (INDEPENDENT_AMBULATORY_CARE_PROVIDER_SITE_OTHER): Payer: BLUE CROSS/BLUE SHIELD

## 2018-02-22 DIAGNOSIS — E1122 Type 2 diabetes mellitus with diabetic chronic kidney disease: Secondary | ICD-10-CM | POA: Diagnosis not present

## 2018-02-22 DIAGNOSIS — E785 Hyperlipidemia, unspecified: Secondary | ICD-10-CM

## 2018-02-22 DIAGNOSIS — I10 Essential (primary) hypertension: Secondary | ICD-10-CM | POA: Diagnosis not present

## 2018-02-22 DIAGNOSIS — N183 Chronic kidney disease, stage 3 unspecified: Secondary | ICD-10-CM

## 2018-02-22 LAB — COMPREHENSIVE METABOLIC PANEL
ALK PHOS: 59 U/L (ref 39–117)
ALT: 22 U/L (ref 0–53)
AST: 19 U/L (ref 0–37)
Albumin: 4 g/dL (ref 3.5–5.2)
BILIRUBIN TOTAL: 1 mg/dL (ref 0.2–1.2)
BUN: 10 mg/dL (ref 6–23)
CALCIUM: 9.5 mg/dL (ref 8.4–10.5)
CO2: 31 meq/L (ref 19–32)
CREATININE: 1.13 mg/dL (ref 0.40–1.50)
Chloride: 102 mEq/L (ref 96–112)
GFR: 71.39 mL/min (ref >60.00)
GLUCOSE: 130 mg/dL — AB (ref 70–99)
Potassium: 4.6 mEq/L (ref 3.5–5.1)
Sodium: 139 mEq/L (ref 135–145)
TOTAL PROTEIN: 6.6 g/dL (ref 6.0–8.3)

## 2018-02-22 LAB — CBC WITH DIFFERENTIAL/PLATELET
BASOS ABS: 0.1 10*3/uL (ref 0.0–0.1)
BASOS PCT: 1.6 % (ref 0.0–3.0)
EOS ABS: 0.3 10*3/uL (ref 0.0–0.7)
Eosinophils Relative: 5.5 % — ABNORMAL HIGH (ref 0.0–5.0)
HEMATOCRIT: 43.9 % (ref 39.0–52.0)
Hemoglobin: 15.1 g/dL (ref 13.0–17.0)
LYMPHS ABS: 1.5 10*3/uL (ref 0.7–4.0)
Lymphocytes Relative: 25.8 % (ref 12.0–46.0)
MCHC: 34.5 g/dL (ref 30.0–36.0)
MCV: 89.1 fl (ref 78.0–100.0)
Monocytes Absolute: 0.4 10*3/uL (ref 0.1–1.0)
Monocytes Relative: 7.1 % (ref 3.0–12.0)
NEUTROS ABS: 3.5 10*3/uL (ref 1.4–7.7)
NEUTROS PCT: 60 % (ref 43.0–77.0)
PLATELETS: 168 10*3/uL (ref 150.0–400.0)
RBC: 4.92 Mil/uL (ref 4.22–5.81)
RDW: 12.6 % (ref 11.5–15.5)
WBC: 5.9 10*3/uL (ref 4.0–10.5)

## 2018-02-22 LAB — LIPID PANEL
CHOL/HDL RATIO: 3
Cholesterol: 132 mg/dL (ref 0–200)
HDL: 43.7 mg/dL (ref >39.00)
LDL Cholesterol: 59 mg/dL (ref 0–99)
NonHDL: 88.18
TRIGLYCERIDES: 148 mg/dL (ref 0.0–149.0)
VLDL: 29.6 mg/dL (ref 0.0–40.0)

## 2018-02-22 LAB — HEMOGLOBIN A1C: Hgb A1c MFr Bld: 6.9 % — ABNORMAL HIGH (ref 4.6–6.5)

## 2018-02-22 NOTE — Assessment & Plan Note (Signed)
PLAN: DM: Diet controlled, last A1c was 6.9, since then he is doing better with diet.  Check a A1c. HTN, on amlodipine, check a CMP and CBC High cholesterol: On Lipitor, check a FLP. Anxiety: Stress decreased since the last visit, doing well. Social: In a couple of weeks he is ready to change his lifestyle and will be traveling on his RV and working on the go.  Advised he will have to be check regularly wherever he is(given his medical problems).  He is definitely welcome to come to the office when needed. Tobacco abuse: Still on Wellbutrin, is definitely stopping the cravings, we talk about stop versus continue.  Elected to continue Wellbutrin since he is having no side effects and is preventing him from falling back on tobacco. All prescriptions were refilled

## 2018-03-08 ENCOUNTER — Ambulatory Visit: Payer: BLUE CROSS/BLUE SHIELD | Admitting: Internal Medicine

## 2018-05-15 IMAGING — DX DG WRIST COMPLETE 3+V*R*
4 series · 4 of 4 positions shown · non-contrast
Comparison: None.

CLINICAL DATA: Right wrist pain for several months

EXAM:
RIGHT WRIST - COMPLETE 3+ VIEW

[wrist pa]
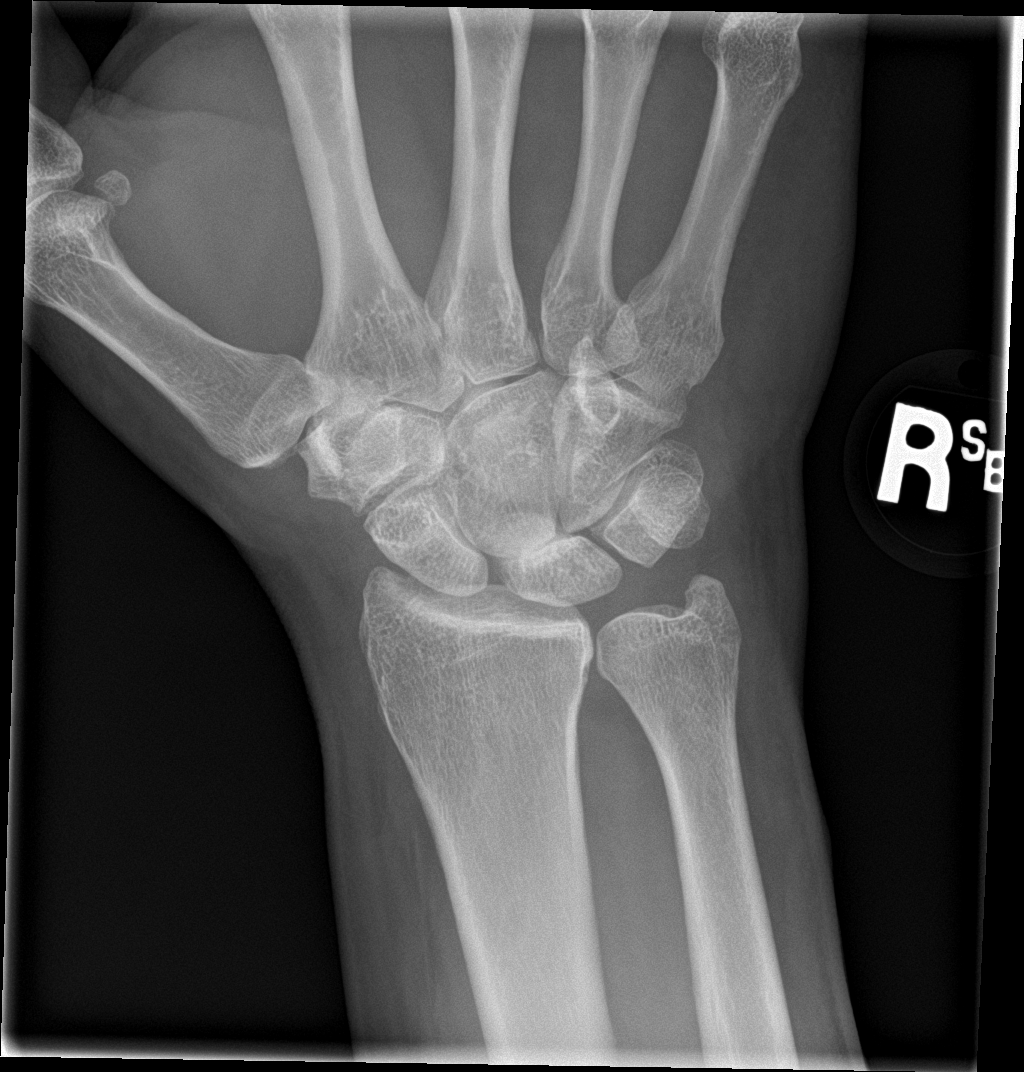

[wrist obl]
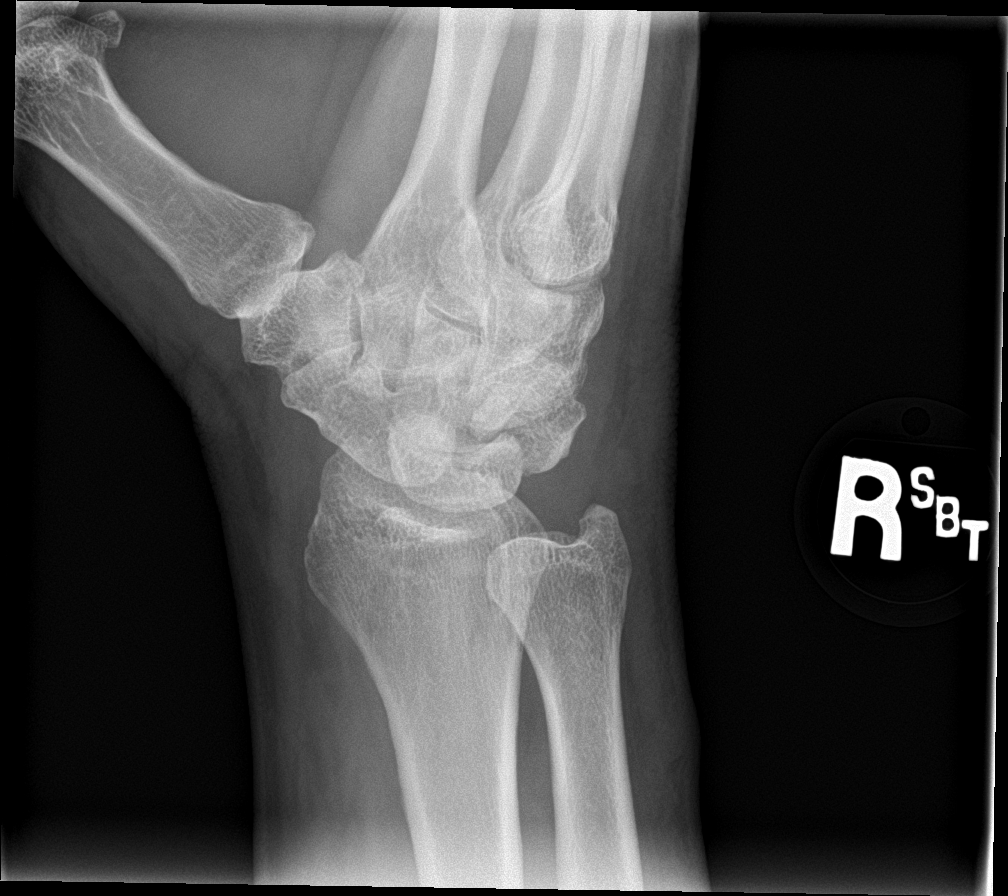

[wrist lat]
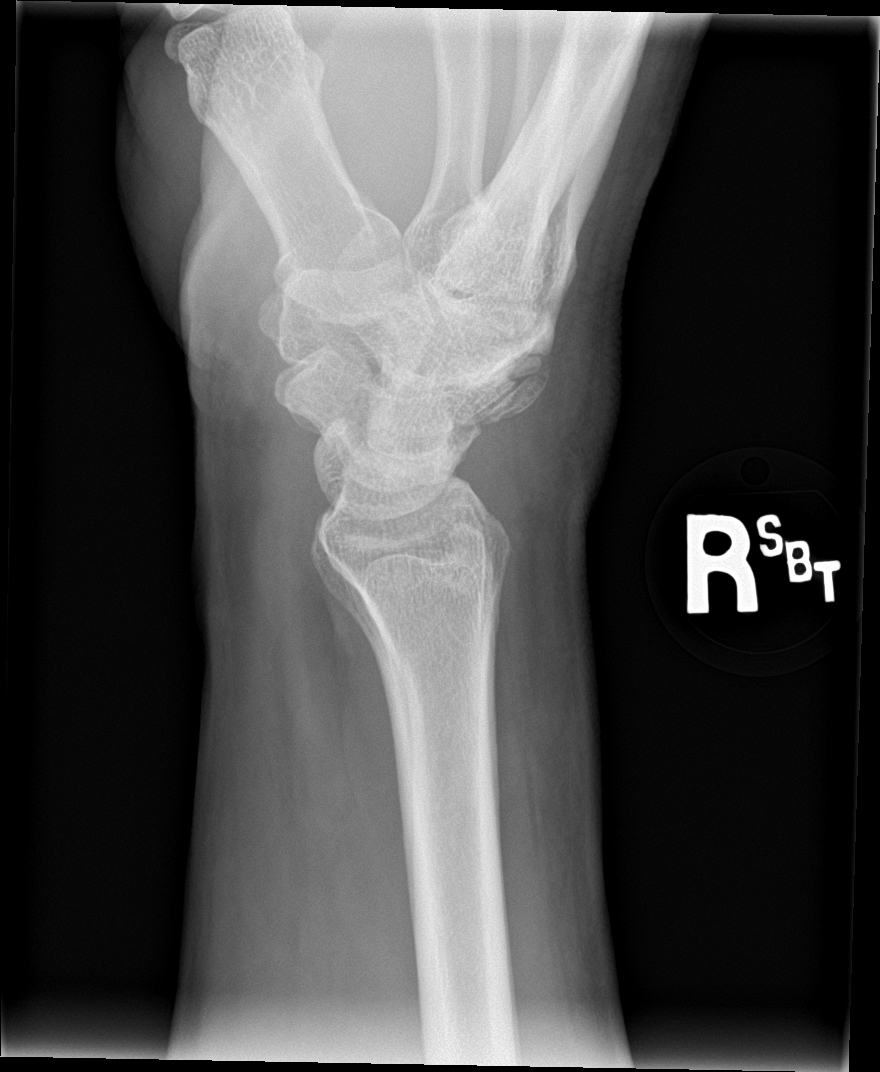

[wrist navicular]
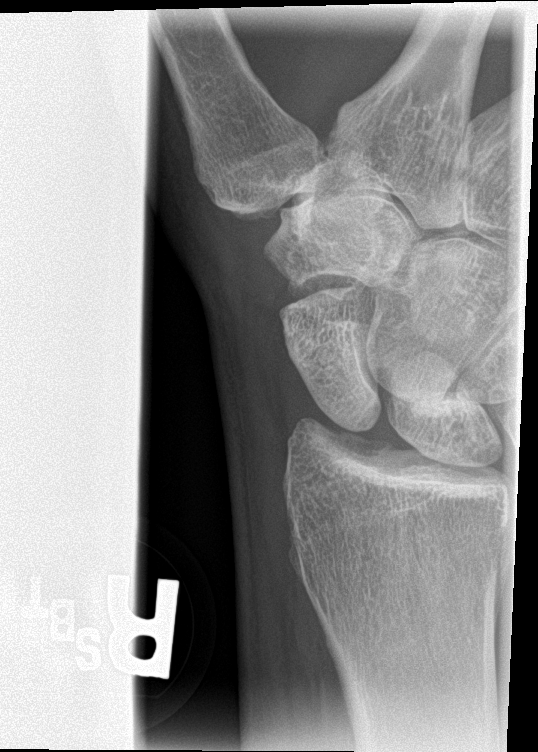

[4 of 4 positions shown; findings below may reference images not displayed]

FINDINGS: Four views of the right wrist submitted. No acute fracture or
subluxation. No radiopaque foreign body. Joint space is preserved.
No periosteal reaction or bony erosion.
IMPRESSION: Negative.

## 2019-03-08 ENCOUNTER — Ambulatory Visit (INDEPENDENT_AMBULATORY_CARE_PROVIDER_SITE_OTHER): Payer: Self-pay | Admitting: Internal Medicine

## 2019-03-08 ENCOUNTER — Other Ambulatory Visit: Payer: Self-pay

## 2019-03-08 DIAGNOSIS — E785 Hyperlipidemia, unspecified: Secondary | ICD-10-CM

## 2019-03-08 DIAGNOSIS — E119 Type 2 diabetes mellitus without complications: Secondary | ICD-10-CM

## 2019-03-08 DIAGNOSIS — I10 Essential (primary) hypertension: Secondary | ICD-10-CM

## 2019-03-08 MED ORDER — AMLODIPINE BESYLATE 10 MG PO TABS: 10.0000 mg | ORAL_TABLET | Freq: Every day | ORAL | 3 refills | Status: DC

## 2019-03-08 MED ORDER — BUPROPION HCL ER (SR) 100 MG PO TB12: 100.0000 mg | ORAL_TABLET | Freq: Two times a day (BID) | ORAL | 3 refills | Status: DC

## 2019-03-08 MED ORDER — ATORVASTATIN CALCIUM 20 MG PO TABS: 20.0000 mg | ORAL_TABLET | Freq: Every day | ORAL | 3 refills | Status: DC

## 2019-03-08 NOTE — Progress Notes (Signed)
Subjective:    Patient ID: Gary Zimmerman, male    DOB: [birth year], 58 y.o.   MRN: [id number]  DOS:  03/08/2019 Type of visit - description: Virtual Visit via Video Note  I connected with@ on 03/09/19 at 11:20 AM EDT by a video enabled telemedicine application and verified that I am speaking with the correct person using two identifiers.   THIS ENCOUNTER IS A VIRTUAL VISIT DUE TO COVID-19 - PATIENT WAS NOT SEEN IN THE OFFICE. PATIENT HAS CONSENTED TO VIRTUAL VISIT / TELEMEDICINE VISIT   Location of patient: home  Location of provider: office  I discussed the limitations of evaluation and management by telemedicine and the availability of in person appointments. The patient expressed understanding and agreed to proceed.  History of Present Illness: Routine follow-up Tobacco: Still taking Wellbutrin, for few weeks he ran out and he went to smoke again.  Needs a refill. DM, no ambulatory CBGs but he is exercising more and his diet remains satisfactory. HTN: Good med compliance, no ambulatory BPs   Review of Systems Denies fever chills No chest pain no difficulty breathing No cough Reports good compliance with COVID-19 precaution measurements.   Past Medical History:  Diagnosis Date  . Allergic rhinitis   . Allergy    seasonal  . Asthma    no problems in years   . DJD (degenerative joint disease) 10/31/2014  . HTN (hypertension)   . Mild hyperlipidemia   . Prediabetes 12/25/2014  . Retinal detachment ~ 10-2012   sees eye MD    Past Surgical History:  Procedure Laterality Date  . HERNIA REPAIR     multiple as a child  . REFRACTIVE SURGERY     lasik  . RETINAL LASER PROCEDURE  2002    R , x 2     Social History   Socioeconomic History  . Marital status: Married    Spouse name: Aurther Lofterry  . Number of children: 3  . Years of education: Not on file  . Highest education level: Not on file  Occupational History  . 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 Financial resource strain: Not on file  . Food insecurity:    Worry: Not on file    Inability: Not on file  . Transportation needs:    Medical: Not on file    Non-medical: Not on file  Tobacco Use  . Smoking status: Former Games developermoker  . Smokeless tobacco: Former NeurosurgeonUser  . Tobacco comment: quit ~ 2016  Substance and Sexual Activity  . Alcohol use: Yes    Alcohol/week: 0.0 standard drinks    Comment: socially   . Drug use: No  . Sexual activity: Not on file  Lifestyle  . Physical activity:    Days per week: Not on file    Minutes per session: Not on file  . Stress: Not on file  Relationships  . Social connections:    Talks on phone: Not on file    Gets together: Not on file    Attends religious service: Not on file    Active member of club or organization: Not on file    Attends meetings of clubs or organizations: Not on file    Relationship status: Not on file  . Intimate partner violence:    Fear of current or ex partner: Not on file    Emotionally abused: Not on file    Physically abused: Not on file    Forced  sexual activity: Not on file  Other Topics Concern  . Not on file  Social History Narrative   Household- pt and wife   Did some college    2 sons, 1 daughter     Linton Ham from [place] to [place] 11-2011        Allergies as of 03/08/2019   No Known Allergies     Medication List       Accurate as of March 08, 2019 11:59 PM. If you have any questions, ask your nurse or doctor.        amLODipine 10 MG tablet Commonly known as:  NORVASC Take 1 tablet (10 mg total) by mouth daily.   atorvastatin 20 MG tablet Commonly known as:  LIPITOR Take 1 tablet (20 mg total) by mouth at bedtime.   azelastine 0.1 % nasal spray Commonly known as:  ASTELIN Place 2 sprays into both nostrils at bedtime. Use in each nostril as directed   buPROPion 100 MG 12 hr tablet Commonly known as:  Wellbutrin SR Take 1 tablet (100 mg total) by mouth 2 (two) times daily.   cetirizine 10  MG tablet Commonly known as:  ZYRTEC Take 10 mg by mouth daily as needed.   diclofenac sodium 1 % Gel Commonly known as:  VOLTAREN Apply 2-4 g topically 4 (four) times daily.   fish oil-omega-3 fatty acids 1000 MG capsule Take 1 g by mouth daily.   fluticasone 50 MCG/ACT nasal spray Commonly known as:  FLONASE Place 2 sprays into both nostrils daily.   multivitamin tablet Take 1 tablet by mouth daily.           Objective:   Physical Exam There were no vitals taken for this visit. This is a virtual video visit.  Alert oriented x3, no apparent distress    Assessment     Assessment DM: A1c increased to 6.8 12-2015 HTN Hyperlipidemia (Previously was somewhat intolerant to statins, as off 06-2017 on Lipitor and doing well) Tobacco-- took chantix, then wellbutrin DJD Allergies  H/o  asthma H/o retinal detachment 2014  PLAN: DM: Diet controlled, last A1c satisfactory HTN: Refill amlodipine, last labs very good High cholesterol: Refill Lipitor, last LDL very good Tobacco: As long as he takes Wellbutrin he is able to stay tobacco free, see HPI.  Refill. Social: Living in his RV in the [place], very happy about it. RTC: CPX already scheduled for September 2020.     I discussed the assessment and treatment plan with the patient. The patient was provided an opportunity to ask questions and all were answered. The patient agreed with the plan and demonstrated an understanding of the instructions.   The patient was advised to call back or seek an in-person evaluation if the symptoms worsen or if the condition fails to improve as anticipated.

## 2019-03-09 NOTE — Assessment & Plan Note (Signed)
DM: Diet controlled, last A1c satisfactory HTN: Refill amlodipine, last labs very good High cholesterol: Refill Lipitor, last LDL very good Tobacco: As long as he takes Wellbutrin he is able to stay tobacco free, see HPI.  Refill. Social: Living in his RV in the [place], very happy about it. RTC: CPX already scheduled for September 2020.

## 2019-06-12 ENCOUNTER — Other Ambulatory Visit: Payer: Self-pay

## 2019-06-13 ENCOUNTER — Ambulatory Visit (INDEPENDENT_AMBULATORY_CARE_PROVIDER_SITE_OTHER): Payer: No Typology Code available for payment source | Admitting: Internal Medicine

## 2019-06-13 ENCOUNTER — Encounter: Payer: Self-pay | Admitting: Internal Medicine

## 2019-06-13 VITALS — BP 142/79 | HR 70 | Temp 98.2°F | Resp 16 | Ht 71.0 in | Wt 221.4 lb

## 2019-06-13 DIAGNOSIS — E119 Type 2 diabetes mellitus without complications: Secondary | ICD-10-CM | POA: Diagnosis not present

## 2019-06-13 DIAGNOSIS — Z0000 Encounter for general adult medical examination without abnormal findings: Secondary | ICD-10-CM

## 2019-06-13 DIAGNOSIS — Z125 Encounter for screening for malignant neoplasm of prostate: Secondary | ICD-10-CM | POA: Diagnosis not present

## 2019-06-13 DIAGNOSIS — E785 Hyperlipidemia, unspecified: Secondary | ICD-10-CM | POA: Diagnosis not present

## 2019-06-13 DIAGNOSIS — Z23 Encounter for immunization: Secondary | ICD-10-CM

## 2019-06-13 LAB — MICROALBUMIN / CREATININE URINE RATIO
Creatinine,U: 50.8 mg/dL
Microalb Creat Ratio: 1.4 mg/g (ref 0.0–30.0)
Microalb, Ur: <0.7 mg/dL (ref 0.0–1.9)

## 2019-06-13 LAB — CBC WITH DIFFERENTIAL/PLATELET
Basophils Absolute: 0.1 10*3/uL (ref 0.0–0.1)
Basophils Relative: 1 % (ref 0.0–3.0)
Eosinophils Absolute: 0.3 10*3/uL (ref 0.0–0.7)
Eosinophils Relative: 4 % (ref 0.0–5.0)
HCT: 47.4 % (ref 39.0–52.0)
Hemoglobin: 15.9 g/dL (ref 13.0–17.0)
Lymphocytes Relative: 29 % (ref 12.0–46.0)
Lymphs Abs: 1.9 10*3/uL (ref 0.7–4.0)
MCHC: 33.7 g/dL (ref 30.0–36.0)
MCV: 90.4 fl (ref 78.0–100.0)
Monocytes Absolute: 0.4 10*3/uL (ref 0.1–1.0)
Monocytes Relative: 6 % (ref 3.0–12.0)
Neutro Abs: 4 10*3/uL (ref 1.4–7.7)
Neutrophils Relative %: 60 % (ref 43.0–77.0)
Platelets: 204 10*3/uL (ref 150.0–400.0)
RBC: 5.24 Mil/uL (ref 4.22–5.81)
RDW: 12.8 % (ref 11.5–15.5)
WBC: 6.7 10*3/uL (ref 4.0–10.5)

## 2019-06-13 LAB — COMPREHENSIVE METABOLIC PANEL
ALT: 21 U/L (ref 0–53)
AST: 17 U/L (ref 0–37)
Albumin: 4.4 g/dL (ref 3.5–5.2)
Alkaline Phosphatase: 74 U/L (ref 39–117)
BUN: 11 mg/dL (ref 6–23)
CO2: 28 mEq/L (ref 19–32)
Calcium: 9.7 mg/dL (ref 8.4–10.5)
Chloride: 102 mEq/L (ref 96–112)
Creatinine, Ser: 0.99 mg/dL (ref 0.40–1.50)
GFR: 77.88 mL/min (ref >60.00)
Glucose, Bld: 98 mg/dL (ref 70–99)
Potassium: 4.2 mEq/L (ref 3.5–5.1)
Sodium: 139 mEq/L (ref 135–145)
Total Bilirubin: 1.2 mg/dL (ref 0.2–1.2)
Total Protein: 7.2 g/dL (ref 6.0–8.3)

## 2019-06-13 LAB — LIPID PANEL
Cholesterol: 132 mg/dL (ref 0–200)
HDL: 49.9 mg/dL (ref >39.00)
LDL Cholesterol: 55 mg/dL (ref 0–99)
NonHDL: 82.56
Total CHOL/HDL Ratio: 3
Triglycerides: 138 mg/dL (ref 0.0–149.0)
VLDL: 27.6 mg/dL (ref 0.0–40.0)

## 2019-06-13 LAB — PSA: PSA: 0.3 ng/mL (ref 0.10–4.00)

## 2019-06-13 LAB — HEMOGLOBIN A1C: Hgb A1c MFr Bld: 6.5 % (ref 4.6–6.5)

## 2019-06-13 LAB — TSH: TSH: 3.6 u[IU]/mL (ref 0.35–4.50)

## 2019-06-13 NOTE — Progress Notes (Signed)
Pre visit review using our clinic review tool, if applicable. No additional management support is needed unless otherwise documented below in the visit note. 

## 2019-06-13 NOTE — Assessment & Plan Note (Signed)
--  Td 2015 - PNM 23: 01-2017. - shingrex: discussed  - flu shot today --CCS:  cscope 12-2014-- tics, 10 years  --Prostate cancer screening: DRE (-), check a PSA  --Diet and exercise discussed --Labs: CMP, FLP, CBC, A1c, micro, TSH, PSA

## 2019-06-13 NOTE — Patient Instructions (Addendum)
Per our records you are due for an eye exam. Please contact your eye doctor to schedule an appointment. Please have them send copies of your office visit notes to [place]. Our fax number is [phone].   GO TO THE LAB : Get the blood work     GO TO THE FRONT DESK Schedule your next appointment   in 6 to 12 months depending on the results. If you are in [place] and need a follow-up, recommend to see a doctor at a urgent care.  They can always reach out to our office and get information.    Check the  blood pressure every 2 weeks BP GOAL is between 110/65 and  135/85. If it is consistently higher or lower, let me know

## 2019-06-13 NOTE — Progress Notes (Signed)
Subjective:    Patient ID: Gary Zimmerman, male    DOB: 1961/03/20, 58 y.o.   MRN: [id number]  DOS:  06/13/2019 Type of visit - description: CPX In general doing well. Still has problems with Achilles tendinitis.  Review of Systems  Other than above, a 14 point review of systems is negative     Past Medical History:  Diagnosis Date  . Allergic rhinitis   . Allergy    seasonal  . Asthma    no problems in years   . DJD (degenerative joint disease) 10/31/2014  . HTN (hypertension)   . Mild hyperlipidemia   . Prediabetes 12/25/2014  . Retinal detachment ~ 10-2012   sees eye MD    Past Surgical History:  Procedure Laterality Date  . HERNIA REPAIR     multiple as a child  . REFRACTIVE SURGERY     lasik  . RETINAL LASER PROCEDURE  2002    R , x 2     Social History   Socioeconomic History  . Marital status: Married    Spouse name: Coralyn Mark  . Number of children: 3  . Years of education: Not on file  . Highest education level: Not on file  Occupational History  . Occupation: Engineer, building services , Academic librarian [employer]  Social Needs  . Financial resource strain: Not on file  . Food insecurity    Worry: Not on file    Inability: Not on file  . Transportation needs    Medical: Not on file    Non-medical: Not on file  Tobacco Use  . Smoking status: Former Research scientist (life sciences)  . Smokeless tobacco: Former Systems developer  . Tobacco comment: quit ~ 2016  Substance and Sexual Activity  . Alcohol use: Yes    Alcohol/week: 0.0 standard drinks    Comment: socially   . Drug use: No  . Sexual activity: Not on file  Lifestyle  . Physical activity    Days per week: Not on file    Minutes per session: Not on file  . Stress: Not on file  Relationships  . Social Herbalist on phone: Not on file    Gets together: Not on file    Attends religious service: Not on file    Active member of club or organization: Not on file    Attends meetings of clubs or organizations: Not on file    Relationship  status: Not on file  . Intimate partner violence    Fear of current or ex partner: Not on file    Emotionally abused: Not on file    Physically abused: Not on file    Forced sexual activity: Not on file  Other Topics Concern  . Not on file  Social History Narrative   Household- pt and wife   Did some college    2 sons, 1 daughter     Dorie Rank from [place] to [place] 11-2011       Family History  Problem Relation Age of Onset  . Diabetes Sister   . CAD Other        ?F, uncle  . Stroke Other        cousin  . Breast cancer Other        MGM  . Colon cancer Neg Hx   . Prostate cancer Neg Hx   . Rectal cancer Neg Hx   . Stomach cancer Neg Hx   . Esophageal cancer Neg Hx  Allergies as of 06/13/2019   No Known Allergies     Medication List       Accurate as of June 13, 2019 11:59 PM. If you have any questions, ask your nurse or doctor.        STOP taking these medications   diclofenac sodium 1 % Gel Commonly known as: VOLTAREN Stopped by: Willow OraJose Hearl Heikes, MD     TAKE these medications   amLODipine 10 MG tablet Commonly known as: NORVASC Take 1 tablet (10 mg total) by mouth daily.   atorvastatin 20 MG tablet Commonly known as: LIPITOR Take 1 tablet (20 mg total) by mouth at bedtime.   azelastine 0.1 % nasal spray Commonly known as: ASTELIN Place 2 sprays into both nostrils at bedtime. Use in each nostril as directed   buPROPion 100 MG 12 hr tablet Commonly known as: Wellbutrin SR Take 1 tablet (100 mg total) by mouth 2 (two) times daily.   cetirizine 10 MG tablet Commonly known as: ZYRTEC Take 10 mg by mouth daily as needed.   fish oil-omega-3 fatty acids 1000 MG capsule Take 1 g by mouth daily.   fluticasone 50 MCG/ACT nasal spray Commonly known as: FLONASE Place 2 sprays into both nostrils daily.   multivitamin tablet Take 1 tablet by mouth daily.           Objective:   Physical Exam BP (!) 142/79 (BP Location: Left Arm, Patient Position:  Sitting, Cuff Size: Normal)   Pulse 70   Temp 98.2 F (36.8 C) (Temporal)   Resp 16   Ht 5\' 11"  (1.803 m)   Wt 221 lb 6 oz (100.4 kg)   SpO2 100%   BMI 30.88 kg/m  General: Well developed, NAD, BMI noted Neck: No  thyromegaly  HEENT:  Normocephalic . Face symmetric, atraumatic Lungs:  CTA B Normal respiratory effort, no intercostal retractions, no accessory muscle use. Heart: RRR,  no murmur.  No pretibial edema bilaterally  Abdomen:  Not distended, soft, non-tender. No rebound or rigidity.   Skin: Exposed areas without rash. Not pale. Not jaundice DM foot exam: No edema, good pulses, pinprick examination normal DRE: Normal sphincter tone, brown stools, normal prostate. Neurologic:  alert & oriented X3.  Speech normal, gait appropriate for age and unassisted Strength symmetric and appropriate for age.  Psych: Cognition and judgment appear intact.  Cooperative with normal attention span and concentration.  Behavior appropriate. No anxious or depressed appearing.     Assessment      Assessment DM: A1c increased to 6.8 12-2015 HTN Hyperlipidemia (Previously was somewhat intolerant to statins, as off 06-2017 on Lipitor and doing well) Tobacco-- took chantix, then wellbutrin DJD Allergies  H/o  asthma H/o retinal detachment 2014  PLAN: Here for CPX DM: Diet controlled, feet exam negative, check A1c micro. HTN: BP today acceptable, on amlodipine, no edema, recommend ambulatory BPs Hyperlipidemia: On Lipitor, checking labs Achilles tendinitis: On and off problem, encouraged to continue using arch support as recommended by sports medicine. Social: [place]. RTC depending on results.  If he is not in [place] and needs a follow-up, rec to be seen @ a local UC wherever he is.

## 2019-06-14 NOTE — Assessment & Plan Note (Signed)
  Here for CPX DM: Diet controlled, feet exam negative, check A1c micro. HTN: BP today acceptable, on amlodipine, no edema, recommend ambulatory BPs Hyperlipidemia: On Lipitor, checking labs Achilles tendinitis: On and off problem, encouraged to continue using arch support as recommended by sports medicine. Social: Lives in his RV, currently in the [place], going to [place] and then to [place]. RTC depending on results.  If he is not in [place] and needs a follow-up, rec to be seen @ a local UC wherever he is.

## 2019-11-28 ENCOUNTER — Encounter: Payer: Self-pay | Admitting: Internal Medicine

## 2019-11-28 MED ORDER — FLUTICASONE PROPIONATE 50 MCG/ACT NA SUSP: 2.0000 | Freq: Every day | NASAL | 5 refills | Status: AC

## 2020-03-31 ENCOUNTER — Other Ambulatory Visit: Payer: Self-pay

## 2020-03-31 MED ORDER — BUPROPION HCL ER (SR) 100 MG PO TB12: 100.0000 mg | ORAL_TABLET | Freq: Two times a day (BID) | ORAL | 0 refills | Status: AC

## 2020-04-08 ENCOUNTER — Encounter: Payer: Self-pay | Admitting: Internal Medicine

## 2020-04-08 MED ORDER — AMLODIPINE BESYLATE 10 MG PO TABS: 10.0000 mg | ORAL_TABLET | Freq: Every day | ORAL | 0 refills | Status: DC

## 2020-04-08 MED ORDER — ATORVASTATIN CALCIUM 20 MG PO TABS: 20.0000 mg | ORAL_TABLET | Freq: Every day | ORAL | 0 refills | Status: DC

## 2020-07-02 ENCOUNTER — Encounter: Payer: Self-pay | Admitting: Internal Medicine

## 2020-07-02 MED ORDER — AMLODIPINE BESYLATE 10 MG PO TABS: 10.0000 mg | ORAL_TABLET | Freq: Every day | ORAL | 0 refills | Status: AC

## 2020-07-02 MED ORDER — ATORVASTATIN CALCIUM 20 MG PO TABS: 20.0000 mg | ORAL_TABLET | Freq: Every day | ORAL | 0 refills | Status: AC
